# Patient Record
Sex: Female | Born: 1945
Health system: Southern US, Community
[De-identification: ages and names within clinical notes are randomized; demographics above are authoritative.]

## PROBLEM LIST (undated history)

## (undated) DIAGNOSIS — N943 Premenstrual tension syndrome: Secondary | ICD-10-CM

## (undated) DIAGNOSIS — M8580 Other specified disorders of bone density and structure, unspecified site: Secondary | ICD-10-CM

## (undated) DIAGNOSIS — M1990 Unspecified osteoarthritis, unspecified site: Secondary | ICD-10-CM

## (undated) DIAGNOSIS — K5790 Diverticulosis of intestine, part unspecified, without perforation or abscess without bleeding: Secondary | ICD-10-CM

## (undated) DIAGNOSIS — G43909 Migraine, unspecified, not intractable, without status migrainosus: Secondary | ICD-10-CM

## (undated) DIAGNOSIS — M4850XA Collapsed vertebra, not elsewhere classified, site unspecified, initial encounter for fracture: Secondary | ICD-10-CM

## (undated) DIAGNOSIS — H409 Unspecified glaucoma: Secondary | ICD-10-CM

## (undated) HISTORY — DX: Unspecified osteoarthritis, unspecified site: M19.90

## (undated) HISTORY — DX: Unspecified glaucoma: H40.9

## (undated) HISTORY — DX: Collapsed vertebra, not elsewhere classified, site unspecified, initial encounter for fracture: M48.50XA

## (undated) HISTORY — DX: Premenstrual tension syndrome: N94.3

## (undated) HISTORY — DX: Diverticulosis of intestine, part unspecified, without perforation or abscess without bleeding: K57.90

## (undated) HISTORY — DX: Other specified disorders of bone density and structure, unspecified site: M85.80

## (undated) HISTORY — DX: Migraine, unspecified, not intractable, without status migrainosus: G43.909

---

## 1998-07-23 ENCOUNTER — Other Ambulatory Visit: Admission: RE | Admit: 1998-07-23 | Discharge: 1998-07-23 | Payer: Self-pay | Admitting: Family Medicine

## 1999-02-24 ENCOUNTER — Encounter: Payer: Self-pay | Admitting: Family Medicine

## 1999-02-24 ENCOUNTER — Ambulatory Visit (HOSPITAL_COMMUNITY): Admission: RE | Admit: 1999-02-24 | Discharge: 1999-02-24 | Payer: Self-pay | Admitting: Family Medicine

## 1999-10-24 ENCOUNTER — Other Ambulatory Visit: Admission: RE | Admit: 1999-10-24 | Discharge: 1999-10-24 | Payer: Self-pay | Admitting: Family Medicine

## 2000-10-27 ENCOUNTER — Other Ambulatory Visit: Admission: RE | Admit: 2000-10-27 | Discharge: 2000-10-27 | Payer: Self-pay | Admitting: Family Medicine

## 2001-02-17 HISTORY — PX: LUMBAR DISC SURGERY: SHX700

## 2001-12-13 ENCOUNTER — Encounter: Payer: Self-pay | Admitting: Family Medicine

## 2001-12-13 ENCOUNTER — Encounter: Admission: RE | Admit: 2001-12-13 | Discharge: 2001-12-13 | Payer: Self-pay | Admitting: Family Medicine

## 2001-12-21 ENCOUNTER — Encounter: Admission: RE | Admit: 2001-12-21 | Discharge: 2002-01-19 | Payer: Self-pay | Admitting: Family Medicine

## 2001-12-30 ENCOUNTER — Other Ambulatory Visit: Admission: RE | Admit: 2001-12-30 | Discharge: 2001-12-30 | Payer: Self-pay | Admitting: Family Medicine

## 2002-01-05 ENCOUNTER — Encounter: Admission: RE | Admit: 2002-01-05 | Discharge: 2002-01-05 | Payer: Self-pay | Admitting: Family Medicine

## 2002-01-05 ENCOUNTER — Encounter: Payer: Self-pay | Admitting: Family Medicine

## 2002-01-07 ENCOUNTER — Encounter: Admission: RE | Admit: 2002-01-07 | Discharge: 2002-01-07 | Payer: Self-pay | Admitting: Family Medicine

## 2002-01-07 ENCOUNTER — Encounter: Payer: Self-pay | Admitting: Family Medicine

## 2002-01-20 ENCOUNTER — Encounter: Payer: Self-pay | Admitting: Neurosurgery

## 2002-01-20 ENCOUNTER — Ambulatory Visit (HOSPITAL_COMMUNITY): Admission: RE | Admit: 2002-01-20 | Discharge: 2002-01-20 | Payer: Self-pay | Admitting: Neurosurgery

## 2002-01-25 ENCOUNTER — Encounter: Payer: Self-pay | Admitting: Neurosurgery

## 2002-01-25 ENCOUNTER — Ambulatory Visit (HOSPITAL_COMMUNITY): Admission: RE | Admit: 2002-01-25 | Discharge: 2002-01-26 | Payer: Self-pay | Admitting: Neurosurgery

## 2003-01-02 ENCOUNTER — Other Ambulatory Visit: Admission: RE | Admit: 2003-01-02 | Discharge: 2003-01-02 | Payer: Self-pay | Admitting: Family Medicine

## 2003-12-26 ENCOUNTER — Ambulatory Visit: Payer: Self-pay | Admitting: Family Medicine

## 2004-01-02 ENCOUNTER — Other Ambulatory Visit: Admission: RE | Admit: 2004-01-02 | Discharge: 2004-01-02 | Payer: Self-pay | Admitting: Family Medicine

## 2004-01-02 ENCOUNTER — Ambulatory Visit: Payer: Self-pay | Admitting: Family Medicine

## 2004-02-06 ENCOUNTER — Ambulatory Visit: Payer: Self-pay | Admitting: Family Medicine

## 2004-02-07 ENCOUNTER — Ambulatory Visit: Payer: Self-pay | Admitting: Family Medicine

## 2004-02-18 HISTORY — PX: COLONOSCOPY: SHX174

## 2004-03-13 ENCOUNTER — Ambulatory Visit: Payer: Self-pay | Admitting: Internal Medicine

## 2004-03-29 ENCOUNTER — Ambulatory Visit: Payer: Self-pay | Admitting: Internal Medicine

## 2004-06-11 ENCOUNTER — Ambulatory Visit: Payer: Self-pay | Admitting: Family Medicine

## 2004-07-03 ENCOUNTER — Ambulatory Visit: Payer: Self-pay | Admitting: Family Medicine

## 2005-01-06 ENCOUNTER — Ambulatory Visit: Payer: Self-pay | Admitting: Family Medicine

## 2005-05-27 ENCOUNTER — Ambulatory Visit: Payer: Self-pay | Admitting: Family Medicine

## 2005-06-17 ENCOUNTER — Other Ambulatory Visit: Admission: RE | Admit: 2005-06-17 | Discharge: 2005-06-17 | Payer: Self-pay | Admitting: Family Medicine

## 2005-06-17 ENCOUNTER — Encounter: Payer: Self-pay | Admitting: Family Medicine

## 2005-06-17 ENCOUNTER — Ambulatory Visit: Payer: Self-pay | Admitting: Family Medicine

## 2005-07-08 DIAGNOSIS — C4491 Basal cell carcinoma of skin, unspecified: Secondary | ICD-10-CM

## 2005-07-08 HISTORY — DX: Basal cell carcinoma of skin, unspecified: C44.91

## 2005-08-25 ENCOUNTER — Ambulatory Visit: Payer: Self-pay | Admitting: Family Medicine

## 2005-10-27 ENCOUNTER — Ambulatory Visit: Payer: Self-pay | Admitting: *Deleted

## 2005-10-27 ENCOUNTER — Ambulatory Visit: Payer: Self-pay | Admitting: Family Medicine

## 2006-05-05 ENCOUNTER — Ambulatory Visit: Payer: Self-pay | Admitting: Family Medicine

## 2006-11-09 ENCOUNTER — Encounter: Payer: Self-pay | Admitting: Family Medicine

## 2007-01-28 ENCOUNTER — Encounter: Payer: Self-pay | Admitting: Family Medicine

## 2007-03-03 ENCOUNTER — Ambulatory Visit: Payer: Self-pay | Admitting: Family Medicine

## 2007-03-03 LAB — CONVERTED CEMR LAB
ALT: 16 units/L (ref 0–35)
AST: 23 units/L (ref 0–37)
Albumin: 4.3 g/dL (ref 3.5–5.2)
Alkaline Phosphatase: 43 units/L (ref 39–117)
BUN: 15 mg/dL (ref 6–23)
Basophils Absolute: 0 10*3/uL (ref 0.0–0.1)
Basophils Relative: 0.1 % (ref 0.0–1.0)
Bilirubin, Direct: 0.1 mg/dL (ref 0.0–0.3)
Calcium: 10 mg/dL (ref 8.4–10.5)
Chloride: 106 meq/L (ref 96–112)
Cholesterol: 252 mg/dL (ref 0–200)
Eosinophils Absolute: 0.1 10*3/uL (ref 0.0–0.6)
Eosinophils Relative: 1.5 % (ref 0.0–5.0)
GFR calc Af Amer: 73 mL/min
HCT: 40.7 % (ref 36.0–46.0)
HDL: 77.8 mg/dL (ref >39.0)
MCV: 96.8 fL (ref 78.0–100.0)
Monocytes Relative: 8.5 % (ref 3.0–11.0)
Potassium: 5 meq/L (ref 3.5–5.1)
RBC: 4.21 M/uL (ref 3.87–5.11)
Sodium: 147 meq/L — ABNORMAL HIGH (ref 135–145)
TSH: 1.11 microintl units/mL (ref 0.35–5.50)
Total Protein: 7.2 g/dL (ref 6.0–8.3)
Triglycerides: 58 mg/dL (ref 0–149)
WBC Urine, dipstick: NEGATIVE
WBC: 5.6 10*3/uL (ref 4.5–10.5)

## 2007-03-15 ENCOUNTER — Other Ambulatory Visit: Admission: RE | Admit: 2007-03-15 | Discharge: 2007-03-15 | Payer: Self-pay | Admitting: Family Medicine

## 2007-03-15 ENCOUNTER — Ambulatory Visit: Payer: Self-pay | Admitting: Family Medicine

## 2007-03-15 ENCOUNTER — Encounter: Payer: Self-pay | Admitting: Family Medicine

## 2007-03-15 DIAGNOSIS — G43909 Migraine, unspecified, not intractable, without status migrainosus: Secondary | ICD-10-CM

## 2007-03-15 DIAGNOSIS — M899 Disorder of bone, unspecified: Secondary | ICD-10-CM | POA: Insufficient documentation

## 2007-03-15 DIAGNOSIS — M949 Disorder of cartilage, unspecified: Secondary | ICD-10-CM

## 2007-03-15 DIAGNOSIS — N959 Unspecified menopausal and perimenopausal disorder: Secondary | ICD-10-CM | POA: Insufficient documentation

## 2007-07-05 DIAGNOSIS — L255 Unspecified contact dermatitis due to plants, except food: Secondary | ICD-10-CM

## 2007-07-13 ENCOUNTER — Ambulatory Visit: Payer: Self-pay | Admitting: Family Medicine

## 2007-10-13 DIAGNOSIS — C4491 Basal cell carcinoma of skin, unspecified: Secondary | ICD-10-CM

## 2007-10-13 HISTORY — DX: Basal cell carcinoma of skin, unspecified: C44.91

## 2007-11-12 DIAGNOSIS — M549 Dorsalgia, unspecified: Secondary | ICD-10-CM

## 2007-11-12 DIAGNOSIS — M545 Low back pain, unspecified: Secondary | ICD-10-CM | POA: Insufficient documentation

## 2007-11-16 ENCOUNTER — Ambulatory Visit: Payer: Self-pay | Admitting: Family Medicine

## 2008-02-01 ENCOUNTER — Encounter: Payer: Self-pay | Admitting: Family Medicine

## 2008-03-22 ENCOUNTER — Ambulatory Visit: Payer: Self-pay | Admitting: Family Medicine

## 2008-03-22 LAB — CONVERTED CEMR LAB
ALT: 12 units/L (ref 0–35)
AST: 24 units/L (ref 0–37)
Albumin: 3.8 g/dL (ref 3.5–5.2)
BUN: 16 mg/dL (ref 6–23)
CO2: 32 meq/L (ref 19–32)
Creatinine, Ser: 0.9 mg/dL (ref 0.4–1.2)
Direct LDL: 143.7 mg/dL
Eosinophils Absolute: 0.1 10*3/uL (ref 0.0–0.7)
Eosinophils Relative: 2.6 % (ref 0.0–5.0)
GFR calc non Af Amer: 68 mL/min
HCT: 38.7 % (ref 36.0–46.0)
Lymphocytes Relative: 28.9 % (ref 12.0–46.0)
MCV: 97 fL (ref 78.0–100.0)
Monocytes Absolute: 0.5 10*3/uL (ref 0.1–1.0)
Neutrophils Relative %: 57.6 % (ref 43.0–77.0)
Platelets: 160 10*3/uL (ref 150–400)
Potassium: 4.5 meq/L (ref 3.5–5.1)
RDW: 12.9 % (ref 11.5–14.6)
Specific Gravity, Urine: 1.02
TSH: 2.15 microintl units/mL (ref 0.35–5.50)
Total Bilirubin: 0.6 mg/dL (ref 0.3–1.2)
Total CHOL/HDL Ratio: 3.2
VLDL: 11 mg/dL (ref 0–40)
pH: 7

## 2008-03-30 ENCOUNTER — Other Ambulatory Visit: Admission: RE | Admit: 2008-03-30 | Discharge: 2008-03-30 | Payer: Self-pay | Admitting: Family Medicine

## 2008-03-30 ENCOUNTER — Ambulatory Visit: Payer: Self-pay | Admitting: Family Medicine

## 2008-03-30 ENCOUNTER — Encounter: Payer: Self-pay | Admitting: Family Medicine

## 2008-03-30 DIAGNOSIS — R141 Gas pain: Secondary | ICD-10-CM

## 2008-03-30 DIAGNOSIS — N63 Unspecified lump in unspecified breast: Secondary | ICD-10-CM | POA: Insufficient documentation

## 2008-03-30 DIAGNOSIS — R143 Flatulence: Secondary | ICD-10-CM

## 2008-03-30 DIAGNOSIS — R142 Eructation: Secondary | ICD-10-CM

## 2008-04-04 ENCOUNTER — Encounter: Payer: Self-pay | Admitting: Family Medicine

## 2008-04-05 ENCOUNTER — Ambulatory Visit: Payer: Self-pay | Admitting: Family Medicine

## 2008-07-20 ENCOUNTER — Telehealth: Payer: Self-pay | Admitting: *Deleted

## 2008-07-24 ENCOUNTER — Encounter: Payer: Self-pay | Admitting: *Deleted

## 2008-07-25 ENCOUNTER — Telehealth: Payer: Self-pay | Admitting: *Deleted

## 2008-07-26 ENCOUNTER — Telehealth: Payer: Self-pay | Admitting: *Deleted

## 2009-02-13 ENCOUNTER — Encounter: Payer: Self-pay | Admitting: *Deleted

## 2009-07-26 ENCOUNTER — Encounter: Admission: RE | Admit: 2009-07-26 | Discharge: 2009-07-26 | Payer: Self-pay | Admitting: Neurosurgery

## 2009-08-03 ENCOUNTER — Ambulatory Visit: Payer: Self-pay | Admitting: Family Medicine

## 2009-08-03 DIAGNOSIS — S22009A Unspecified fracture of unspecified thoracic vertebra, initial encounter for closed fracture: Secondary | ICD-10-CM | POA: Insufficient documentation

## 2009-08-08 ENCOUNTER — Ambulatory Visit: Payer: Self-pay | Admitting: Internal Medicine

## 2009-08-08 ENCOUNTER — Encounter: Payer: Self-pay | Admitting: Family Medicine

## 2010-01-29 ENCOUNTER — Ambulatory Visit: Payer: Self-pay | Admitting: Family Medicine

## 2010-01-29 LAB — CONVERTED CEMR LAB
ALT: 17 units/L (ref 0–35)
Albumin: 4 g/dL (ref 3.5–5.2)
Basophils Absolute: 0 10*3/uL (ref 0.0–0.1)
Basophils Relative: 0.9 % (ref 0.0–3.0)
Bilirubin Urine: NEGATIVE
Bilirubin, Direct: 0.1 mg/dL (ref 0.0–0.3)
Blood in Urine, dipstick: NEGATIVE
Calcium: 9.2 mg/dL (ref 8.4–10.5)
Cholesterol: 261 mg/dL — ABNORMAL HIGH (ref 0–200)
Creatinine, Ser: 0.9 mg/dL (ref 0.4–1.2)
Direct LDL: 166 mg/dL
Eosinophils Relative: 2.2 % (ref 0.0–5.0)
HDL: 68.2 mg/dL (ref >39.00)
Hemoglobin: 13.4 g/dL (ref 12.0–15.0)
MCV: 96.1 fL (ref 78.0–100.0)
Monocytes Relative: 11.1 % (ref 3.0–12.0)
Neutro Abs: 2.4 10*3/uL (ref 1.4–7.7)
Neutrophils Relative %: 58.6 % (ref 43.0–77.0)
Nitrite: NEGATIVE
Platelets: 209 10*3/uL (ref 150.0–400.0)
Potassium: 5.2 meq/L — ABNORMAL HIGH (ref 3.5–5.1)
Protein, U semiquant: NEGATIVE
RBC: 4.06 M/uL (ref 3.87–5.11)
RDW: 13.8 % (ref 11.5–14.6)
Total Protein: 6.7 g/dL (ref 6.0–8.3)

## 2010-02-05 ENCOUNTER — Encounter: Payer: Self-pay | Admitting: Family Medicine

## 2010-02-05 ENCOUNTER — Ambulatory Visit: Payer: Self-pay | Admitting: Family Medicine

## 2010-02-14 ENCOUNTER — Telehealth: Payer: Self-pay | Admitting: Family Medicine

## 2010-03-10 ENCOUNTER — Encounter: Payer: Self-pay | Admitting: Neurosurgery

## 2010-03-21 NOTE — Miscellaneous (Signed)
Summary: BONE DENSITY  Clinical Lists Changes  Orders: Added new Test order of T-Lumbar Vertebral Assessment (77082) - Signed 

## 2010-03-21 NOTE — Progress Notes (Signed)
Summary: rx request  Phone Note Call from Patient   Summary of Call: rx request  Initial call taken by: Kern Reap CMA Duncan Dull),  February 14, 2010 9:32 AM    New/Updated Medications: IBUPROFEN 600 MG TABS (IBUPROFEN) take one tab once daily as needed for pain ONE-A-DAY WOMENS 50 PLUS  TABS (MULTIPLE VITAMINS-MINERALS) take one tab once daily FISH OIL  OIL (FISH OIL) take one tab once daily PHILLIPS COLON HEALTH  CAPS (PROBIOTIC PRODUCT) take one tab once daily FIBER SELECT GUMMIES  CHEW (FIBER) take one tab once daily Prescriptions: PHILLIPS COLON HEALTH  CAPS (PROBIOTIC PRODUCT) take one tab once daily  #100 x 4   Entered by:   Kern Reap CMA (AAMA)   Authorized by:   Roderick Pee MD   Signed by:   Kern Reap CMA (AAMA) on 02/14/2010   Method used:   Print then Give to Patient   RxID:   [id number] FISH OIL  OIL (FISH OIL) take one tab once daily  #100 x 4   Entered by:   Kern Reap CMA (AAMA)   Authorized by:   Roderick Pee MD   Signed by:   Kern Reap CMA (AAMA) on 02/14/2010   Method used:   Print then Give to Patient   RxID:   [id number] ONE-A-DAY WOMENS 50 PLUS  TABS (MULTIPLE VITAMINS-MINERALS) take one tab once daily  #100 x 4   Entered by:   Kern Reap CMA (AAMA)   Authorized by:   Roderick Pee MD   Signed by:   Kern Reap CMA (AAMA) on 02/14/2010   Method used:   Print then Give to Patient   RxID:   [id number] IBUPROFEN 600 MG TABS (IBUPROFEN) take one tab once daily as needed for pain  #100 x 4   Entered by:   Kern Reap CMA (AAMA)   Authorized by:   Roderick Pee MD   Signed by:   Kern Reap CMA (AAMA) on 02/14/2010   Method used:   Print then Give to Patient   RxID:   [id number]

## 2010-03-21 NOTE — Assessment & Plan Note (Signed)
Summary: CPX  - OK PER RACHEL, CMA // RS   Vital Signs:  Patient profile:   65 year old female Height:      65.75 inches Weight:      147 pounds BMI:     23.99 Temp:     98.0 degrees F oral BP sitting:   120 / 78  (left arm) Cuff size:   regular CC: cpx-- annual physical   Is Patient Diabetic? No   CC:  cpx-- annual physical  .  History of Present Illness: Rebecca Cameron is a delightful, 65 year old, single female, nonsmoker former Event organiser, who comes in today for annual physical examination  She uses hormonal cream once or twice weekly. For vaginal dryness.  Also, Imitrex nasal spray p.r.n. for migraine headaches.  In the last 12 months.  Her migraines have decreased.  She only had 3.  This year.  She also takes calcium, vitamin D, and nabumetone 500 mg b.i.d., p.r.n. for joint pain.  She has a lumbar disk problem, which flares up, occasionally she'll take her Flexeril and Vicodin at bedtime p.r.n.  She gets routine eye care........Marland Kitchen pressures now are borderline at 18, followed by ophthalmologist...Marland KitchenMarland KitchenMarland Kitchen dental care, BSE monthly, annual mammography, colonoscopy, normal, tetanus, 2006, seasonal flu 2011, shingles 2010.  Allergies: No Known Drug Allergies  Past History:  Past medical, surgical, family and social histories (including risk factors) reviewed, and no changes noted (except as noted below).  Past Medical History: Reviewed history from 03/15/2007 and no changes required. PMS Migraine Headaches Osteopenia lumbar disk surgery 2003  Past Surgical History: Reviewed history from 09/18/2006 and no changes required. Lumbar Disc-2003 Colonoscopy-03/29/2006  Family History: Reviewed history from 03/15/2007 and no changes required. father died at age 25, fractured hip.  Underlying alcoholism mother died Alzheimer's disease  3 brothers, one died of alcoholism one sister, severe glaucoma  Social History: Reviewed history from 03/15/2007 and no changes  required. 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 No apparent alopecia or balding. Eyes:  No corneal or conjunctival inflammation noted. EOMI. Perrla. Funduscopic exam benign, without hemorrhages, exudates or papilledema. Vision grossly normal. Ears:  External ear exam shows no significant lesions or deformities.  Otoscopic examination reveals clear canals, tympanic membranes are intact bilaterally without bulging, retraction, inflammation or discharge. Hearing is grossly normal bilaterally. Nose:  External nasal examination shows no deformity or inflammation. Nasal mucosa are pink and moist without lesions or exudates. Mouth:  Oral mucosa and oropharynx without lesions or exudates.  Teeth in good repair. Neck:  No deformities, masses, or tenderness noted. Chest Wall:  No deformities, masses, or tenderness noted. Breasts:  No mass, nodules, thickening, tenderness, bulging, retraction, inflamation, nipple discharge or skin changes noted.   Lungs:  Normal respiratory effort, chest expands symmetrically. Lungs are clear to auscultation, no crackles or wheezes. Heart:  Normal rate and regular rhythm. S1 and S2 normal without gallop, murmur, click, rub or other extra sounds. Abdomen:  Bowel sounds positive,abdomen soft and non-tender without masses, organomegaly or hernias noted. Rectal:  No external abnormalities noted. Normal sphincter tone. No rectal masses or tenderness. Genitalia:  Pelvic Exam:        External: normal female genitalia without lesions or masses        Vagina:  normal without lesions or masses        Cervix: normal without lesions or masses        Adnexa: normal bimanual  exam without masses or fullness        Uterus: normal by palpation        Pap smear: not performed Msk:  No deformity or scoliosis noted of thoracic or lumbar spine.   Pulses:  R and L carotid,radial,femoral,dorsalis pedis and posterior tibial pulses are full and equal bilaterally Extremities:  No clubbing, cyanosis, edema, or deformity noted with normal full range of motion of all joints.   Neurologic:  No cranial nerve deficits noted. Station and gait are normal. Plantar reflexes are down-going bilaterally. DTRs are symmetrical throughout. Sensory, motor and coordinative functions appear intact. Skin:  Intact without suspicious lesions or rashes Cervical Nodes:  No lymphadenopathy noted Axillary Nodes:  No palpable lymphadenopathy Inguinal Nodes:  No significant adenopathy Psych:  Cognition and judgment appear intact. Alert and cooperative with normal attention span and concentration. No apparent delusions, illusions, hallucinations   Impression & Recommendations:  Problem # 1:  RHUS DERMATITIS (ICD-692.6) Assessment Improved  Problem # 2:  MIGRAINE HEADACHE (ICD-346.90) Assessment: Improved  Her updated medication list for this problem includes:    Imitrex 20 Mg/act Soln (Sumatriptan) ..... Uad    Nabumetone 500 Mg Tabs (Nabumetone) .Marland Kitchen... Take one tab two times a day    Vicodin Es 7.5-750 Mg Tabs (Hydrocodone-acetaminophen) .Marland Kitchen... 1 tab @ bedtime  Orders: Prescription Created Electronically [phone])  Problem # 3:  HEALTH SCREENING (ICD-V70.0) Assessment: Unchanged  Orders: Prescription Created Electronically [phone])  Problem # 4:  COMPRESSION FRACTURE, THORACIC VERTEBRA (ICD-805.2) Assessment: Improved  Complete Medication List: 1)  Flexeril 10 Mg Tabs (Cyclobenzaprine hcl) .... Prn 2)  Calcitrate/vitamin D 315-200 Mg-unit Tabs (Calcium citrate-vitamin d) .... Take 1 tablet by mouth once a day 3)  Imitrex 20 Mg/act Soln (Sumatriptan) .... Uad 4)  Estrace 0.1 Mg/gm Crea  (Estradiol) .... Insert 4 gm,  2 times a week 5)  Nabumetone 500 Mg Tabs (Nabumetone) .... Take one tab two times a day 6)  Vicodin Es 7.5-750 Mg Tabs (Hydrocodone-acetaminophen) .Marland Kitchen.. 1 tab @ bedtime  Other Orders: EKG w/ Interpretation (93000)  Patient Instructions: 1)  Please schedule a follow-up appointment in 1 year. Prescriptions: CALCITRATE/VITAMIN D 315-200 MG-UNIT  TABS (CALCIUM CITRATE-VITAMIN D) Take 1 tablet by mouth once a day  #100 x 3   Entered and Authorized by:   Roderick Pee MD   Signed by:   Roderick Pee MD on 02/05/2010   Method used:   Print then Give to Patient   RxID:   [id number] NABUMETONE 500 MG TABS (NABUMETONE) take one tab two times a day  #100 x 6   Entered and Authorized by:   Roderick Pee MD   Signed by:   Roderick Pee MD on 02/05/2010   Method used:   Electronically to        [facility] (retail)       [address] [place], [place]  [address]       Ph: [phone]       Fax: [phone]   RxID:   [id number] ESTRACE 0.1 MG/GM  CREA (ESTRADIOL) insert 4 gm,  2 times a week  #127.5 Gram x 6   Entered and Authorized by:   Roderick Pee  MD   Signed by:   Roderick Pee MD on 02/05/2010   Method used:   Electronically to        [facility] (retail)       [address] [place], [place]  [address]       Ph: [phone]       Fax: [phone]   RxID:   [id number] Willette Brace 20 MG/ACT  SOLN (SUMATRIPTAN) UAD  #6 Each x 6   Entered and Authorized by:   Roderick Pee MD   Signed by:   Roderick Pee MD on 02/05/2010   Method used:   Electronically to        [facility] [phone]* (retail)       [address] [place], [place]  [address]       Ph: [phone]       Fax: [phone]   RxID:   [id number]    Orders Added: 1)  Prescription Created Electronically [phone] 2)  Est. Patient 40-64 years  [99396] 3)  EKG w/ Interpretation [93000]   Immunization History:  Influenza Immunization History:    Influenza:  historical (11/20/2009)   Immunization History:  Influenza Immunization History:    Influenza:  Historical (11/20/2009)

## 2010-03-21 NOTE — Assessment & Plan Note (Signed)
Summary: back pain/njr   Vital Signs:  Patient profile:   65 year old female Height:      65.75 inches Weight:      142 pounds BMI:     23.18 Temp:     98.2 degrees F oral BP sitting:   130 / 90  (left arm) Cuff size:   regular  Vitals Entered By: Kern Reap CMA Duncan Dull) (August 03, 2009 1:53 PM) CC: back pain, irritation on left ankle   CC:  back pain and irritation on left ankle.  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  She went to see Dr. Jeral Fruit.  Her neurosurgeon.  Plain x-rays appeared normal however, because of severe pain.  They did a MRI on June the ninth.  MRI showed a 40% loss of the T5 vertebrae.  Completely healed.  Dr. Jeral Fruit told her she had a  old  compression fracture, but nothing the neurosurgery Can do,    He referred her back to me for follow-up.  She's been on Fosamax in the past because of osteo- opinion.  We stopped the Fosamax two years ago because of the negative studies about Fosamax, and osteopenia.  She wonders if she should have another bone density.  She also is a red, irritated.  The lesion on her right lower ankle.  She noticed this on Tuesday.  She played golf on Saturday 3 days before.  She has a history of contact dermatitis in the past  her symptoms include back pain around the T5 area.  Sometimes is constant, sometimes will come and go.  It typically if she can get off her feet and takes something for pain.  The next day the soreness is gone.  Allergies: No Known Drug Allergies  Past History:  Past medical, surgical, family and social histories (including risk factors) reviewed for relevance to current acute and chronic problems.  Past Medical History: Reviewed history from 03/15/2007 and no changes required. PMS Migraine Headaches Osteopenia lumbar disk surgery 2003  Past Surgical History: Reviewed  history from 09/18/2006 and no changes required. Lumbar Disc-2003 Colonoscopy-03/29/2006  Family History: Reviewed history from 03/15/2007 and no changes required. father died at age 63, fractured hip.  Underlying alcoholism mother died Alzheimer's disease  3 brothers, one died of alcoholism one sister, severe glaucoma  Social History: Reviewed history from 03/15/2007 and no changes required. 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 Prn 2)  Calcitrate/vitamin D 315-200 Mg-unit Tabs (Calcium citrate-vitamin d) .... Take 1 tablet by mouth once a day 3)  Imitrex 20 Mg/act Soln (Sumatriptan) .... Uad 4)  Estrace 0.1 Mg/gm Crea (Estradiol) .... Insert 4 gm,  2 times a week 5)  Nabumetone 500 Mg Tabs (Nabumetone) .... Take one tab two times a day 6)  Vicodin Es 7.5-750 Mg Tabs (Hydrocodone-acetaminophen) .Marland Kitchen.. 1 tab @ bedtime  Patient Instructions: 1)  I will call you when I get  the report back from your bone density. 2)  When your back starts hurting, take a half of Flexeril and Vicodin at bedtime.  If that does not work and your having a lot of pain.  The next day, take a half of Flexeril and Vicodin 3 times a day, and stay at bed rest for a day.  Return p.r.n.Marland Kitchen  Remember to take calcium and vitamin D daily Prescriptions: FLEXERIL 10 MG  TABS (CYCLOBENZAPRINE HCL) prn  #50 x 2   Entered and Authorized by:   Roderick Pee MD   Signed by:   Roderick Pee  MD on 08/03/2009   Method used:   Print then Give to Patient   RxID:   [id number] VICODIN ES 7.5-750 MG TABS (HYDROCODONE-ACETAMINOPHEN) 1 tab @ bedtime  #50 x 2   Entered and Authorized by:   Roderick Pee MD   Signed by:   Roderick Pee MD on 08/03/2009   Method used:   Print then Give to Patient   RxID:   [id number]

## 2010-06-12 ENCOUNTER — Other Ambulatory Visit: Payer: Self-pay | Admitting: Neurosurgery

## 2010-06-12 DIAGNOSIS — M549 Dorsalgia, unspecified: Secondary | ICD-10-CM

## 2010-06-13 ENCOUNTER — Ambulatory Visit
Admission: RE | Admit: 2010-06-13 | Discharge: 2010-06-13 | Disposition: A | Discharge status: Home / Self Care | Payer: PRIVATE HEALTH INSURANCE | Source: Ambulatory Visit | Attending: Neurosurgery | Admitting: Neurosurgery

## 2010-06-13 DIAGNOSIS — M549 Dorsalgia, unspecified: Secondary | ICD-10-CM

## 2010-07-04 ENCOUNTER — Other Ambulatory Visit: Payer: Self-pay | Admitting: Neurosurgery

## 2010-07-04 DIAGNOSIS — M546 Pain in thoracic spine: Secondary | ICD-10-CM

## 2010-07-05 NOTE — H&P (Signed)
NAME:  Rebecca Cameron, Rebecca Cameron                        ACCOUNT NO.:  [id number]   MEDICAL RECORD NO.:  [id number]                   PATIENT TYPE:  OIB   LOCATION:  NA                                   FACILITY:  [facility]   PHYSICIAN:  Hilda Lias, M.D.                DATE OF BIRTH:  [birth year]   DATE OF ADMISSION:  01/25/2002  DATE OF DISCHARGE:                                HISTORY & PHYSICAL   HISTORY OF PRESENT ILLNESS:  This is a lady who was seen by me on December 3  because of back pain with radiation down to the hip all the way  posterolateral to the right foot associated with weakness.  The patient was  miserable and she came to my office crying.  The MRI was read as  unremarkable and because of that I decided to bring her the following day to  have a myelogram followed by a CT scan which showed that she has a foraminal  herniated disk.  Surgery was advised, but the patient decided not to have  surgery.  Over the weekend she called my office because she was getting  worse and she wanted to proceed with surgery.  She had told me that her  family did not want her to have surgery and we talked about epidural  injections, but then she declined and she wanted to go ahead with surgery as  soon as possible.  The patient denied any pain in the left leg.   PAST MEDICAL HISTORY:  Negative.   ALLERGIES:  She is not allergic to any medication.   SOCIAL HISTORY:  She drinks socially.   FAMILY HISTORY:  Negative.   REVIEW OF SYMPTOMS:  Only positive for back and right leg pain.   PHYSICAL EXAMINATION:  GENERAL: The patient came to my office as she had  weakness of the right leg.  HEENT:  Head, ears, nose and throat are normal.  Neck is normal.  LUNGS:  Clear.  HEART:  Heart sounds are normal.  There are no murmurs.  ABDOMEN:  Normal.  EXTREMITIES:  Normal.  NEUROLOGIC:  Mental status is normal.  Cranial nerves are normal.  Strength  is 5/5 except for weakness with flexion of the right  foot.  Reflexes are  present.  The right ankle jerk is present with _________ of the left leg.  Full vibration on the right side possibly at about 45 degrees.   LABORATORY DATA:  The myelogram showed that indeed she has a herniated disk  at L5-S1 with symptoms on the right side mostly in the foramen compromising  the S1 nerve root.   IMPRESSION:  L5-S1 herniated disk.    RECOMMENDATIONS:  The patient will be admitted for surgery.  The procedure  will be a right L5-S1 diskectomy using the Metrix system as well as x-ray.  The risks were explained to include infection, cerebrospinal fluid leak,  worsening of pain, paresthesias, no improvement whatsoever and need for  further surgery.                                               Hilda Lias, M.D.    EB/MEDQ  D:  01/25/2002  T:  01/25/2002  Job:  [id number]

## 2010-07-05 NOTE — Op Note (Signed)
NAME:  Rebecca Cameron, Rebecca Cameron                        ACCOUNT NO.:  [id number]   MEDICAL RECORD NO.:  [id number]                   PATIENT TYPE:  OIB   LOCATION:  3009                                 FACILITY:  [facility]   PHYSICIAN:  Hilda Lias, M.D.                DATE OF BIRTH:  [birth year]   DATE OF PROCEDURE:  01/25/2002  DATE OF DISCHARGE:                                 OPERATIVE REPORT   PREOPERATIVE DIAGNOSIS:  Right L5-S1 herniated disk with S1 radiculopathy.   POSTOPERATIVE DIAGNOSIS:  Right L5-S1 herniated disk with at least seven  fragments going into the body of L5.   PROCEDURE:  Right L5-S1 diskectomy using the Met-RX system as well as the  microscope.  C-arm.   SURGEON:  Hilda Lias, M.D.   ASSISTANT:  Stefani Dama, M.D.   CLINICAL HISTORY:  The patient is a 65 year old female who was seen by me in  my office because of back and right leg pain.  MRI was remarkable.  MRI  showed the possibility of herniated disk at the level of 5-1 in the foramen.  The patient was given the offer of conservative treatment, but she wanted to  proceed with surgery.  The surgery was fully explained to her.   DESCRIPTION OF PROCEDURE:  The patient was taken to the OR, and she was  positioned in a prone manner.  The back was prepped with Betadine.  With the  C-arm we were able to localize the area of the L5-S1 lamina as well as the  facet.  Incision about an inch away from the midline was made after  _______________.  The incision was carried down and with the dilator, we  were able to introduce a final 7 mm and inch dilator into the L5-S1.  We  brought the microscope into the area.  We drilled.  We drilled the lower  lamina of L5 and the upper part of S1 and one-third of the medial facet.  The yellow ligament was also excised.  Then retraction was done.  The S1  nerve root was found to be swollen.  Retraction was made, and there was a  bulging disk.  We tried to enter the disk space,  but it was quite narrow.  Then investigating going into the body of L5, we found at least eight  fragments of disk compromising the underside of the S1 as well as the L5.  After we did this, we did a foraminotomy carried over the S1 and L5 nerve  root.  Valsalva maneuver was negative.  Investigation showed no more  fragments.  From then on Depo-Medrol and fentanyl were left, and the wound  was closed with Vicryl and Steri-Strip.  Hilda Lias, M.D.    EB/MEDQ  D:  01/25/2002  T:  01/26/2002  Job:  [id number]

## 2010-07-08 ENCOUNTER — Ambulatory Visit
Admission: RE | Admit: 2010-07-08 | Discharge: 2010-07-08 | Disposition: A | Discharge status: Home / Self Care | Payer: PRIVATE HEALTH INSURANCE | Source: Ambulatory Visit | Attending: Neurosurgery | Admitting: Neurosurgery

## 2010-07-08 DIAGNOSIS — M546 Pain in thoracic spine: Secondary | ICD-10-CM

## 2010-11-06 ENCOUNTER — Other Ambulatory Visit: Payer: Self-pay | Admitting: *Deleted

## 2010-11-06 MED ORDER — HYDROCODONE-ACETAMINOPHEN 7.5-750 MG PO TABS: 1.0000 | ORAL_TABLET | Freq: Four times a day (QID) | ORAL | Status: DC | PRN

## 2010-12-10 ENCOUNTER — Ambulatory Visit (HOSPITAL_BASED_OUTPATIENT_CLINIC_OR_DEPARTMENT_OTHER)
Admission: RE | Admit: 2010-12-10 | Discharge: 2010-12-10 | Disposition: A | Discharge status: Home / Self Care | Payer: BC Managed Care – PPO | Source: Ambulatory Visit | Attending: Orthopedic Surgery | Admitting: Orthopedic Surgery

## 2010-12-10 ENCOUNTER — Other Ambulatory Visit: Payer: Self-pay | Admitting: Orthopedic Surgery

## 2010-12-10 DIAGNOSIS — M6740 Ganglion, unspecified site: Secondary | ICD-10-CM | POA: Insufficient documentation

## 2010-12-10 LAB — POCT HEMOGLOBIN-HEMACUE: Hemoglobin: 14.5 g/dL (ref 12.0–15.0)

## 2010-12-20 NOTE — Op Note (Signed)
  NAMELISSETT, FAVORITE              ACCOUNT NO.:  [id number]  MEDICAL RECORD NO.:  [id number]  LOCATION:                                 FACILITY:  PHYSICIAN:  Cindee Salt, M.D.            DATE OF BIRTH:  DATE OF PROCEDURE:  12/10/2010 DATE OF DISCHARGE:                              OPERATIVE REPORT   PREOPERATIVE DIAGNOSIS:  Volar radial wrist ganglion, left wrist.  POSTOPERATIVE DIAGNOSIS:  Volar radial wrist ganglion, left wrist.  OPERATION:  Excision of volar radial wrist ganglion, left wrist.  SURGEON:  Cindee Salt, MD  ASSISTANT:  Betha Loa, MD  ANESTHESIA:  General with local infiltration.  ANESTHESIOLOGIST:  Burna Forts, MD  HISTORY:  The patient is a 65 year old female with history of a mass on the volar radial aspect of her left wrist, this pulsatile in nature; however, is cystic on examination.  She is desirous to having it removed.  Pre, peri and postoperative course have been discussed along with risks and complications.  She is aware there is no guarantee with the surgery, possibility of infection, recurrence of injury to arteries, nerves, tendons, incomplete relief of symptoms and dystrophy.  In the preoperative area, the patient was seen, the extremity marked by both the patient and surgeon, and antibiotic given.  PROCEDURE:  The patient was brought to the operating room where a general anesthetic was carried out without difficulty.  She was prepped using ChloraPrep, supine position with the left arm free.  A 3-minute dry time was allowed, time-out taken, confirming the patient and procedure.  A longitudinal incision was made over the mass carried down through the subcutaneous tissue.  Radial artery was identified and found to be drape directly over the ulnar aspect of the cyst.  With blunt and sharp dissection, this was dissected free.  The cyst was identified, the stalk followed down to the radiocarpal joint.  This was opened.  The cyst excised and  sent to pathology.  The wound was irrigated.  The capsular opening through which the cyst emerged was then closed with interrupted 4-0 Vicryl repeat sutures, the subcutaneous tissue was closed with interrupted 4-0 Vicryl, and the skin with interrupted 4-0 Vicryl Rapide.  A local infiltration was then given with 0.25% Marcaine without epinephrine, approximately 5 mL was used.  A sterile compressive dressing, splint to the wrist with the fingers free was applied.  On deflation of the tourniquet, all fingers were immediately pinked.  She was taken to the recovery room for observation in satisfactory condition.  She will be discharged to home return to the [facility] in 1 week, on Vicodin.          ______________________________ Cindee Salt, M.D.     GK/MEDQ  D:  12/10/2010  T:  12/10/2010  Job:  [id number]  Electronically Signed by Cindee Salt M.D. on 12/20/2010 03:45:23 PM

## 2011-02-05 ENCOUNTER — Other Ambulatory Visit (INDEPENDENT_AMBULATORY_CARE_PROVIDER_SITE_OTHER): Payer: BC Managed Care – PPO

## 2011-02-05 DIAGNOSIS — Z0000 Encounter for general adult medical examination without abnormal findings: Secondary | ICD-10-CM

## 2011-02-05 LAB — POCT URINALYSIS DIPSTICK
Bilirubin, UA: NEGATIVE
Blood, UA: NEGATIVE
Ketones, UA: NEGATIVE
Nitrite, UA: POSITIVE
pH, UA: 8.5

## 2011-02-05 LAB — BASIC METABOLIC PANEL
BUN: 20 mg/dL (ref 6–23)
CO2: 29 mEq/L (ref 19–32)
Calcium: 9.5 mg/dL (ref 8.4–10.5)
GFR: 70.52 mL/min (ref >60.00)
Potassium: 4.5 mEq/L (ref 3.5–5.1)

## 2011-02-05 LAB — CBC WITH DIFFERENTIAL/PLATELET
Eosinophils Absolute: 0 10*3/uL (ref 0.0–0.7)
Eosinophils Relative: 0.9 % (ref 0.0–5.0)
HCT: 39 % (ref 36.0–46.0)
Lymphs Abs: 1.2 10*3/uL (ref 0.7–4.0)
Monocytes Relative: 11.7 % (ref 3.0–12.0)
Neutrophils Relative %: 62.6 % (ref 43.0–77.0)
Platelets: 216 10*3/uL (ref 150.0–400.0)

## 2011-02-05 LAB — LIPID PANEL
Cholesterol: 251 mg/dL — ABNORMAL HIGH (ref 0–200)
Total CHOL/HDL Ratio: 3
Triglycerides: 95 mg/dL (ref 0.0–149.0)
VLDL: 19 mg/dL (ref 0.0–40.0)

## 2011-02-05 LAB — HEPATIC FUNCTION PANEL
ALT: 18 U/L (ref 0–35)
Albumin: 4.2 g/dL (ref 3.5–5.2)
Alkaline Phosphatase: 63 U/L (ref 39–117)
Total Protein: 7.4 g/dL (ref 6.0–8.3)

## 2011-02-10 ENCOUNTER — Encounter: Payer: Self-pay | Admitting: Family Medicine

## 2011-02-12 ENCOUNTER — Ambulatory Visit (INDEPENDENT_AMBULATORY_CARE_PROVIDER_SITE_OTHER): Payer: BC Managed Care – PPO | Admitting: Family Medicine

## 2011-02-12 ENCOUNTER — Encounter: Payer: Self-pay | Admitting: Family Medicine

## 2011-02-12 DIAGNOSIS — S22009A Unspecified fracture of unspecified thoracic vertebra, initial encounter for closed fracture: Secondary | ICD-10-CM

## 2011-02-12 DIAGNOSIS — N959 Unspecified menopausal and perimenopausal disorder: Secondary | ICD-10-CM

## 2011-02-12 DIAGNOSIS — G43909 Migraine, unspecified, not intractable, without status migrainosus: Secondary | ICD-10-CM

## 2011-02-12 DIAGNOSIS — M899 Disorder of bone, unspecified: Secondary | ICD-10-CM

## 2011-02-12 DIAGNOSIS — M549 Dorsalgia, unspecified: Secondary | ICD-10-CM

## 2011-02-12 DIAGNOSIS — Z136 Encounter for screening for cardiovascular disorders: Secondary | ICD-10-CM

## 2011-02-12 DIAGNOSIS — Z0000 Encounter for general adult medical examination without abnormal findings: Secondary | ICD-10-CM

## 2011-02-12 MED ORDER — NABUMETONE 500 MG PO TABS: 500.0000 mg | ORAL_TABLET | Freq: Two times a day (BID) | ORAL | Status: DC

## 2011-02-12 MED ORDER — HYDROCODONE-ACETAMINOPHEN 7.5-750 MG PO TABS: 1.0000 | ORAL_TABLET | Freq: Four times a day (QID) | ORAL | Status: DC | PRN

## 2011-02-12 MED ORDER — CYCLOBENZAPRINE HCL 10 MG PO TABS: 10.0000 mg | ORAL_TABLET | Freq: Every day | ORAL | Status: DC | PRN

## 2011-02-12 MED ORDER — ESTRADIOL 0.1 MG/GM VA CREA: 2.0000 g | TOPICAL_CREAM | Freq: Every day | VAGINAL | Status: DC

## 2011-02-12 NOTE — Progress Notes (Signed)
  Subjective:    Patient ID: Rebecca Cameron, female    DOB: [birth year], 65 y.o.   MRN: [id number]  HPI Rebecca Cameron is a 65 year old single female, nonsmoker, who comes in today for general physical examination because of history of osteopenia.......... 2  non-traumatic Compression fractures in her thoracic spine, postmenopausal vaginal dryness, migraine headaches.  She has been seen at the pain clinic and he uses Lidoderm patches p.r.n.  Also, she is on injectable Forteo daily for two years because of the two spontaneous fractures.  Her bone density showed some osteopenia.  No osteoporosis and again the fractures were nontraumatic.  She gets routine eye care, hearing normal, greater dental care, BSE monthly, and a mammography, colonoscopy, normal, tetanus, 2006, seasonal flu shot 2012, shingles 2010.  She uses the Premarin vaginal cream twice weekly for vaginal dryness.  She's never had any GYN problems.  Therefore, recommend Pap every 3 years   Review of Systems  Constitutional: Negative.   HENT: Negative.   Eyes: Negative.   Respiratory: Negative.   Cardiovascular: Negative.   Gastrointestinal: Negative.   Genitourinary: Negative.   Musculoskeletal: Negative.   Neurological: Negative.   Hematological: Negative.   Psychiatric/Behavioral: Negative.        Objective:   Physical Exam  Constitutional: She appears well-developed and well-nourished.  HENT:  Head: Normocephalic and atraumatic.  Right Ear: External ear normal.  Left Ear: External ear normal.  Nose: Nose normal.  Mouth/Throat: Oropharynx is clear and moist.  Eyes: EOM are normal. Pupils are equal, round, and reactive to light.  Neck: Normal range of motion. Neck supple. No thyromegaly present.  Cardiovascular: Normal rate, regular rhythm, normal heart sounds and intact distal pulses.  Exam reveals no gallop and no friction rub.   No murmur heard. Pulmonary/Chest: Effort normal and breath sounds normal.  Abdominal: Soft.  Bowel sounds are normal. She exhibits no distension and no mass. There is no tenderness. There is no rebound.  Genitourinary:       Bilateral breast exam normal  Musculoskeletal: Normal range of motion.  Lymphadenopathy:    She has no cervical adenopathy.  Neurological: She is alert. She has normal reflexes. No cranial nerve deficit. She exhibits normal muscle tone. Coordination normal.  Skin: Skin is warm and dry.  Psychiatric: She has a normal mood and affect. Her behavior is normal. Judgment and thought content normal.          Assessment & Plan:  Healthy female.  Non-traumatic compression fractures x 2.  Plan continue calcium vitamin D, exercise, and Forteo daily, x 2 years.  Postmenopausal vaginal dryness, use the Premarin vaginal cream twice weekly.  Back pain, recurrent, Flexeril, Vicodin p.r.n.  Migraine headaches, decreasing in frequency and severity, Imitrex p.r.n.  Return one year or sooner if any problem

## 2011-04-14 ENCOUNTER — Other Ambulatory Visit: Payer: Self-pay | Admitting: Family Medicine

## 2011-07-07 ENCOUNTER — Encounter: Payer: Self-pay | Admitting: Family Medicine

## 2011-07-07 ENCOUNTER — Ambulatory Visit (INDEPENDENT_AMBULATORY_CARE_PROVIDER_SITE_OTHER): Payer: BC Managed Care – PPO | Admitting: Family Medicine

## 2011-07-07 VITALS — BP 110/70 | Temp 97.8°F | Wt 144.0 lb

## 2011-07-07 DIAGNOSIS — M549 Dorsalgia, unspecified: Secondary | ICD-10-CM

## 2011-07-07 DIAGNOSIS — M949 Disorder of cartilage, unspecified: Secondary | ICD-10-CM

## 2011-07-07 DIAGNOSIS — M899 Disorder of bone, unspecified: Secondary | ICD-10-CM

## 2011-07-07 DIAGNOSIS — J45909 Unspecified asthma, uncomplicated: Secondary | ICD-10-CM

## 2011-07-07 MED ORDER — TRAMADOL HCL 50 MG PO TABS: ORAL_TABLET | ORAL | Status: DC

## 2011-07-07 MED ORDER — PREDNISONE 20 MG PO TABS: ORAL_TABLET | ORAL | Status: DC

## 2011-07-07 NOTE — Patient Instructions (Signed)
Take the prednisone as directed  Tramadol one half to one tablet twice daily as needed for back pain  Call u  dermatologist and insist on an appointment sometime in the next 2 weeks  We will get you set up a consult with Dr. Thyra Breed for a second opinion regarding the back pain

## 2011-07-07 NOTE — Progress Notes (Signed)
  Subjective:    Patient ID: Rebecca Cameron, female    DOB: 01-09-1946, 66 y.o.   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 He sent her to their pain specialist Dr. Ollen Bowl. He prescribed a Lidoderm patch and Vicodin one half tab twice a day when necessary she also went to see Dr. Cleon Gustin who put her on Forteo one shot daily in the morning. She doesn't feel like this combination has helped and would like a second opinion.  A month ago she developed head congestion postnasal drip and cough is persisted. There is fever minimal sputum production  She also has a spot on the left side of her face. She has an appointment to see her dermatologist because she's had a previous history of skin cancer    Review of Systems    general pulmonary neuromuscular review of systems otherwise negative Objective:   Physical Exam Well-developed and nourished female in no acute distress HEENT negative neck was supple no adenopathy lungs shows symmetrical breath sounds late mild expiratory symmetrical wheezing  Skin exam shows a lesion left side of her face appears to be a superficial basal cell       Assessment & Plan:  Reactive airway disease plan prednisone burst and taper  History of osteopenia with compression fractures x2 consult with Thyra Breed regarding ongoing pain  Abnormal lesion left face refer to dermatology

## 2011-08-28 ENCOUNTER — Other Ambulatory Visit: Payer: Self-pay | Admitting: Family Medicine

## 2011-09-16 ENCOUNTER — Other Ambulatory Visit: Payer: Self-pay | Admitting: Family Medicine

## 2011-11-12 ENCOUNTER — Ambulatory Visit (INDEPENDENT_AMBULATORY_CARE_PROVIDER_SITE_OTHER): Payer: BC Managed Care – PPO | Admitting: Family Medicine

## 2011-11-12 ENCOUNTER — Ambulatory Visit (INDEPENDENT_AMBULATORY_CARE_PROVIDER_SITE_OTHER)
Admission: RE | Admit: 2011-11-12 | Discharge: 2011-11-12 | Disposition: A | Discharge status: Home / Self Care | Payer: BC Managed Care – PPO | Source: Ambulatory Visit | Attending: Family Medicine | Admitting: Family Medicine

## 2011-11-12 ENCOUNTER — Encounter: Payer: Self-pay | Admitting: Family Medicine

## 2011-11-12 VITALS — BP 120/88 | Temp 97.6°F | Wt 140.0 lb

## 2011-11-12 DIAGNOSIS — M25552 Pain in left hip: Secondary | ICD-10-CM

## 2011-11-12 DIAGNOSIS — M25559 Pain in unspecified hip: Secondary | ICD-10-CM

## 2011-11-12 DIAGNOSIS — M549 Dorsalgia, unspecified: Secondary | ICD-10-CM

## 2011-11-12 MED ORDER — HYDROCODONE-ACETAMINOPHEN 7.5-750 MG PO TABS: 1.0000 | ORAL_TABLET | Freq: Four times a day (QID) | ORAL | Status: DC | PRN

## 2011-11-12 MED ORDER — LIDOCAINE 5 % EX PTCH: 1.0000 | MEDICATED_PATCH | CUTANEOUS | Status: DC

## 2011-11-12 NOTE — Progress Notes (Signed)
  Subjective:    Patient ID: Rebecca Cameron, female    DOB: January 18, 1946, 66 y.o.   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 She's noticed pain and she points to her left hip as a source of her discomfort for 2 months. No history of trauma. The pain has not gotten worse. It's a dull constant ache and she states as a 5 on a scale of 1-10. No fever chills vomiting diarrhea change in bowel habits etc.   Review of Systems    general and orthopedic review of systems otherwise negative Objective:   Physical Exam Well-developed and nourished female no acute distress examination the abdomen was normal she's tenderness over that left pelvic bone       Assessment & Plan:  Pain left pelvic bone unknown etiology x-ray to rule out underlying pathology

## 2011-11-12 NOTE — Patient Instructions (Signed)
Go to the main office now for the x-ray will call you a report

## 2012-03-22 ENCOUNTER — Other Ambulatory Visit (INDEPENDENT_AMBULATORY_CARE_PROVIDER_SITE_OTHER): Payer: BC Managed Care – PPO

## 2012-03-22 DIAGNOSIS — Z0000 Encounter for general adult medical examination without abnormal findings: Secondary | ICD-10-CM

## 2012-03-22 LAB — BASIC METABOLIC PANEL
CO2: 27 mEq/L (ref 19–32)
Calcium: 9.2 mg/dL (ref 8.4–10.5)
Chloride: 108 mEq/L (ref 96–112)
Creatinine, Ser: 0.9 mg/dL (ref 0.4–1.2)
Sodium: 142 mEq/L (ref 135–145)

## 2012-03-22 LAB — LIPID PANEL
Cholesterol: 240 mg/dL — ABNORMAL HIGH (ref 0–200)
Total CHOL/HDL Ratio: 3
VLDL: 11.6 mg/dL (ref 0.0–40.0)

## 2012-03-22 LAB — CBC WITH DIFFERENTIAL/PLATELET
Basophils Relative: 1.1 % (ref 0.0–3.0)
Eosinophils Relative: 3 % (ref 0.0–5.0)
Hemoglobin: 13.5 g/dL (ref 12.0–15.0)
Lymphocytes Relative: 25.1 % (ref 12.0–46.0)
Neutro Abs: 2.7 10*3/uL (ref 1.4–7.7)
Neutrophils Relative %: 61.1 % (ref 43.0–77.0)
RBC: 4.24 Mil/uL (ref 3.87–5.11)
WBC: 4.4 10*3/uL — ABNORMAL LOW (ref 4.5–10.5)

## 2012-03-22 LAB — HEPATIC FUNCTION PANEL
ALT: 14 U/L (ref 0–35)
AST: 18 U/L (ref 0–37)
Albumin: 4 g/dL (ref 3.5–5.2)
Alkaline Phosphatase: 54 U/L (ref 39–117)
Bilirubin, Direct: 0 mg/dL (ref 0.0–0.3)
Total Protein: 7 g/dL (ref 6.0–8.3)

## 2012-03-22 LAB — POCT URINALYSIS DIPSTICK
Blood, UA: NEGATIVE
Ketones, UA: NEGATIVE
Protein, UA: NEGATIVE
Spec Grav, UA: 1.015
Urobilinogen, UA: 0.2
pH, UA: 8

## 2012-03-22 LAB — LDL CHOLESTEROL, DIRECT: Direct LDL: 140.7 mg/dL

## 2012-03-29 ENCOUNTER — Other Ambulatory Visit (HOSPITAL_COMMUNITY)
Admission: RE | Admit: 2012-03-29 | Discharge: 2012-03-29 | Disposition: A | Discharge status: Home / Self Care | Payer: BC Managed Care – PPO | Source: Ambulatory Visit | Attending: Family Medicine | Admitting: Family Medicine

## 2012-03-29 ENCOUNTER — Other Ambulatory Visit: Payer: Self-pay | Admitting: *Deleted

## 2012-03-29 ENCOUNTER — Ambulatory Visit (INDEPENDENT_AMBULATORY_CARE_PROVIDER_SITE_OTHER): Payer: BC Managed Care – PPO | Admitting: Family Medicine

## 2012-03-29 ENCOUNTER — Encounter: Payer: Self-pay | Admitting: Family Medicine

## 2012-03-29 VITALS — BP 110/74 | HR 68 | Temp 97.7°F | Ht 64.5 in | Wt 146.0 lb

## 2012-03-29 DIAGNOSIS — M949 Disorder of cartilage, unspecified: Secondary | ICD-10-CM

## 2012-03-29 DIAGNOSIS — N63 Unspecified lump in unspecified breast: Secondary | ICD-10-CM

## 2012-03-29 DIAGNOSIS — M549 Dorsalgia, unspecified: Secondary | ICD-10-CM

## 2012-03-29 DIAGNOSIS — Z0000 Encounter for general adult medical examination without abnormal findings: Secondary | ICD-10-CM

## 2012-03-29 DIAGNOSIS — Z01419 Encounter for gynecological examination (general) (routine) without abnormal findings: Secondary | ICD-10-CM | POA: Insufficient documentation

## 2012-03-29 DIAGNOSIS — G43909 Migraine, unspecified, not intractable, without status migrainosus: Secondary | ICD-10-CM

## 2012-03-29 DIAGNOSIS — N959 Unspecified menopausal and perimenopausal disorder: Secondary | ICD-10-CM

## 2012-03-29 DIAGNOSIS — M899 Disorder of bone, unspecified: Secondary | ICD-10-CM

## 2012-03-29 MED ORDER — TRAMADOL HCL 50 MG PO TABS: ORAL_TABLET | ORAL | Status: DC

## 2012-03-29 MED ORDER — ESTRADIOL 0.1 MG/GM VA CREA: TOPICAL_CREAM | VAGINAL | Status: DC

## 2012-03-29 MED ORDER — CYCLOBENZAPRINE HCL 10 MG PO TABS: 10.0000 mg | ORAL_TABLET | Freq: Every day | ORAL | Status: DC | PRN

## 2012-03-29 MED ORDER — SUMATRIPTAN 20 MG/ACT NA SOLN: NASAL | Status: DC

## 2012-03-29 MED ORDER — HYDROCODONE-ACETAMINOPHEN 7.5-750 MG PO TABS: 1.0000 | ORAL_TABLET | Freq: Three times a day (TID) | ORAL | Status: DC | PRN

## 2012-03-29 NOTE — Progress Notes (Signed)
  Subjective:    Patient ID: Rebecca Cameron, female    DOB: [birth year], 67 y.o.   MRN: [id number]  HPI Marishka is a 67 year old single female nonsmoker still working in law-enforcement who comes in today for a Medicare wellness examination because of a history of chronic back pain, post menopausal vaginal dryness, osteopenia, migraine headaches  She states she's felt well no complaints.  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   Review of Systems  Constitutional: Negative.   HENT: Negative.   Eyes: Negative.   Respiratory: Negative.   Cardiovascular: Negative.   Gastrointestinal: Negative.   Genitourinary: Negative.   Musculoskeletal: Negative.   Neurological: Negative.   Psychiatric/Behavioral: Negative.        Objective:   Physical Exam  Constitutional: She appears well-developed and well-nourished.  HENT:  Head: Normocephalic and atraumatic.  Right Ear: External ear normal.  Left Ear: External ear normal.  Nose: Nose normal.  Mouth/Throat: Oropharynx is clear and moist.  Eyes: EOM are normal. Pupils are equal, round, and reactive to light.  Neck: Normal range of motion. Neck supple. No thyromegaly present.  Cardiovascular: Normal rate, regular rhythm, normal heart sounds and intact distal pulses.  Exam reveals no gallop and no friction rub.   No murmur heard. Pulmonary/Chest: Effort normal and breath sounds normal.  Abdominal: Soft. Bowel sounds are normal. She exhibits no distension and no  mass. There is no tenderness. There is no rebound.  Genitourinary: Vagina normal and uterus normal. Guaiac negative stool. No vaginal discharge found.  Bilateral breast exam normal  Musculoskeletal: Normal range of motion.  Lymphadenopathy:    She has no cervical adenopathy.  Neurological: She is alert. She has normal reflexes. No cranial nerve deficit. She exhibits normal muscle tone. Coordination normal.  Skin: Skin is warm and dry.  Total body skin exam normal she has some new seborrheic keratosis the largest one is the left breast around 6:00 at the periphery however it's not bothering her therefore we'll leave alone  Psychiatric: She has a normal mood and affect. Her behavior is normal. Judgment and thought content normal.          Assessment & Plan:  Healthy female  History of osteopenia continue exercise calcium and Forteo daily until July  Chronic low back pain secondary to degenerative disease Aleve one twice daily Flexeril Vicodin and/or tramadol when necessary for flareups of pain.  Migraine headaches continue Imitrex when necessary

## 2012-03-29 NOTE — Patient Instructions (Signed)
Continue your current medications  Followup in 1 year sooner if any problems 

## 2012-03-29 NOTE — Telephone Encounter (Signed)
Sue Lush from pharmacy called lost e-scribe for vaginal cream need order to be sent again. Rx resent to pharmacy.

## 2012-05-27 ENCOUNTER — Ambulatory Visit (INDEPENDENT_AMBULATORY_CARE_PROVIDER_SITE_OTHER): Payer: BC Managed Care – PPO | Admitting: Family Medicine

## 2012-05-27 ENCOUNTER — Encounter: Payer: Self-pay | Admitting: Family Medicine

## 2012-05-27 VITALS — BP 98/64 | Temp 97.8°F | Wt 149.0 lb

## 2012-05-27 DIAGNOSIS — M7021 Olecranon bursitis, right elbow: Secondary | ICD-10-CM | POA: Insufficient documentation

## 2012-05-27 DIAGNOSIS — M549 Dorsalgia, unspecified: Secondary | ICD-10-CM

## 2012-05-27 DIAGNOSIS — M7020 Olecranon bursitis, unspecified elbow: Secondary | ICD-10-CM

## 2012-05-27 NOTE — Progress Notes (Signed)
  Subjective:    Patient ID: Rebecca Cameron, female    DOB: [birth year], 67 y.o.   MRN: [id number]  HPIGloria is a 67 year old female who comes in today for evaluation of swelling of her right elbow  This has occurred gradually over the last couple weeks. No history of trauma.  She also has intermittent back pain  She is due to retire this summer and needs a note out of work from July 1 through the fourth    Review of Systems Review of systems otherwise negative    Objective:   Physical Exam  Well-developed well nourished female no acute distress examination of right elbow shows a golf ball size olecranon bursitis      Assessment & Plan:  Olecranon bursitis without consult when necessary  Low back pain note out of work

## 2012-05-27 NOTE — Patient Instructions (Signed)
If the bursitis becomes symptomatic then I would recommend Dr. Norlene Campbell or Albertha Ghee for consultation  Out of work July 1 to the fourth for low back pain  Return when necessary

## 2012-07-21 ENCOUNTER — Telehealth: Payer: Self-pay | Admitting: *Deleted

## 2012-07-21 NOTE — Telephone Encounter (Signed)
New dosage called into pharmacy

## 2012-07-26 ENCOUNTER — Encounter: Payer: Self-pay | Admitting: *Deleted

## 2012-07-30 ENCOUNTER — Telehealth: Payer: Self-pay | Admitting: Family Medicine

## 2012-07-30 NOTE — Telephone Encounter (Signed)
Pt has one more paper she needs signed.  Will drop by later today to see you.

## 2012-07-30 NOTE — Telephone Encounter (Signed)
Spoke with patient.

## 2012-09-15 ENCOUNTER — Encounter: Payer: Self-pay | Admitting: Internal Medicine

## 2012-09-15 ENCOUNTER — Telehealth: Payer: Self-pay | Admitting: Family Medicine

## 2012-09-15 ENCOUNTER — Ambulatory Visit (INDEPENDENT_AMBULATORY_CARE_PROVIDER_SITE_OTHER): Payer: Self-pay | Admitting: Internal Medicine

## 2012-09-15 VITALS — BP 122/82 | HR 69 | Temp 98.1°F | Wt 150.0 lb

## 2012-09-15 DIAGNOSIS — H938X1 Other specified disorders of right ear: Secondary | ICD-10-CM

## 2012-09-15 DIAGNOSIS — H938X9 Other specified disorders of ear, unspecified ear: Secondary | ICD-10-CM

## 2012-09-15 NOTE — Patient Instructions (Addendum)
Uncertain cause of your symptoms but your  Ear exam and  Heart exam is normal .

## 2012-09-15 NOTE — Telephone Encounter (Signed)
Patient Information:  Caller Name: [place]  Phone: [phone]  Patient: Rebecca Cameron, Rebecca Cameron  Gender: Female  DOB: 01-30-1946  Age: 67 Years  PCP: Kelle Darting [facility])  Office Follow Up:  Does the office need to follow up with this patient?: No  Instructions For The Office: N/A  RN Note:  Denies ear trauma or drainage. Uses Q-tip when gets out of shower.  Leaving to go out of town for 3 weeks.  Symptoms  Reason For Call & Symptoms: Intermittent right ear "feedback" or flutter/vibration in ear. Sympom began after temporary crown inserted.  Reviewed Health History In EMR: Yes  Reviewed Medications In EMR: Yes  Reviewed Allergies In EMR: Yes  Reviewed Surgeries / Procedures: Yes  Date of Onset of Symptoms: 09/01/2012  Guideline(s) Used:  Ear - Congestion  Disposition Per Guideline:   See Today in Office  Reason For Disposition Reached:   Patient wants to be seen  Advice Given:  N/A  Patient Will Follow Care Advice:  YES  Appointment Scheduled:  09/15/2012 16:00:00 Appointment Scheduled Provider:  Berniece Andreas (Family Practice)

## 2012-09-15 NOTE — Progress Notes (Signed)
Chief Complaint  Patient presents with  . Ear fluttering/vibrations    HPI: Patient comes in today for SDA for  new problem evaluation. Couple  of weeks ago had vibration in right ear. 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   Neg dental problem .   Crown put on and comes  and goes. In retrospect  Wondering if getting off balance but no falling  Like when getting up   Out of sorts.  Lasts seconds.  ? If equilibrium Going out of town for [employer].    So in to check.   gone Saturday  Gone for 3 months .   ROS: See pertinent positives and negatives per HPI. No fever tinnitus  Injury   Past Medical History  Diagnosis Date  . PMS (premenstrual syndrome)   . Migraine headache   . Osteopenia   . Compression fracture of spine, non-traumatic     Family History  Problem Relation Age of Onset  . Alzheimer's disease Mother   . Hip fracture Father   . Alcohol abuse Father   . Glaucoma Sister   . Alcohol abuse Brother     History   Social History  . Marital Status: Single    Spouse Name: N/A    Number of Children: N/A  . Years of Education: N/A   Social History Main Topics  . Smoking status: Never Smoker   . Smokeless tobacco: None  . Alcohol Use: Yes  . Drug Use: No  . Sexually Active:    Other Topics Concern  . None   Social History Narrative  . None    Outpatient Encounter Prescriptions as of 09/15/2012  Medication Sig Dispense Refill  . calcium-vitamin D 250-100 MG-UNIT per tablet Take 1 tablet by mouth 2 (two) times daily.        . cyclobenzaprine (FLEXERIL) 10 MG tablet Take 1 tablet (10 mg total) by mouth daily as needed.  50 tablet  3  . estradiol (ESTRACE VAGINAL) 0.1 MG/GM vaginal cream INSERT 4 GM VAGINALY TWICE WEEKLY  125.523 g  6  . FIBER SELECT GUMMIES PO Take by mouth.        . fish oil-omega-3 fatty acids 1000 MG capsule Take 1 g by mouth daily.        Marland Kitchen HYDROcodone-acetaminophen (NORCO) 7.5-325 MG per tablet Take 1 tablet by mouth every 8 (eight) hours as  needed for pain.      Marland Kitchen ibuprofen (ADVIL,MOTRIN) 600 MG tablet Take 600 mg by mouth every 6 (six) hours as needed.        . Multiple Vitamins-Minerals (ONE-A-DAY WOMENS 50 PLUS PO) Take by mouth.        . naproxen sodium (ANAPROX) 220 MG tablet Take 220 mg by mouth 2 (two) times daily with a meal.      . SUMAtriptan (IMITREX) 20 MG/ACT nasal spray UAD  1 Inhaler  6  . traMADol (ULTRAM) 50 MG tablet One half to one tablet twice daily for pain  60 tablet  4  . nabumetone (RELAFEN) 500 MG tablet TAKE 1 TABLET BY MOUTH TWICE A DAY  100 tablet  6   No facility-administered encounter medications on file as of 09/15/2012.    EXAM:  BP 122/82  Pulse 69  Temp(Src) 98.1 F (36.7 C) (Oral)  Wt 150 lb (68.04 kg)  BMI 25.36 kg/m2  SpO2 97%  Body mass index is 25.36 kg/(m^2).  GENERAL: vitals reviewed and listed above, alert, oriented, appears well hydrated  and in no acute distress  HEENT: atraumatic, conjunctiva  clear, no obvious abnormalities on inspection of external nose and ears tms in tact eacs clear right ? If small hair in canal.   No wax seen OP : no lesion edema or exudate  NECK: no obvious masses on inspection palpation  No bruits Neuro: non focal  Cn 3-12 nl   Nl gait  Cn see hearing screen 20 right 40 left but pt says heard sounds just didn't realize it was a the test.  MS: moves all extremities without noticeable focal  abnormality PSYCH: pleasant and cooperative, no obvious depression or anxiety  ASSESSMENT AND PLAN:  Discussed the following assessment and plan:  Abnormal ear sensation, right - uncertain cause no alarm features ? if small hair  in canal  otherwise no fb  expectant managment   Fu if  persistent or progressive at this time would not put any thing in ear  -Patient advised to return or notify health care team  if symptoms worsen or persist or new concerns arise.  Patient Instructions  Uncertain cause of your symptoms but your  Ear exam and  Heart exam is normal  .    Burna Mortimer K. Panosh M.D.

## 2013-02-23 ENCOUNTER — Telehealth: Payer: Self-pay | Admitting: Family Medicine

## 2013-02-23 NOTE — Telephone Encounter (Signed)
Patient is calling because she has a cough, no energy, no fever.  She has been taking Hydromet and it has helped the cough but she only has a little left.  She would like a refill of Hydromet and possibly a Rx for prednisone?  [facility]

## 2013-02-23 NOTE — Telephone Encounter (Signed)
Pt thinks she has bronchitis. Would like to speak w/ you. Battling this since cristmas, did not want to come in duue to all the sickness. pls call

## 2013-02-24 MED ORDER — HYDROCODONE-HOMATROPINE 5-1.5 MG/5ML PO SYRP: 5.0000 mL | ORAL_SOLUTION | Freq: Three times a day (TID) | ORAL | Status: DC | PRN

## 2013-02-24 MED ORDER — PREDNISONE 20 MG PO TABS: 20.0000 mg | ORAL_TABLET | Freq: Every day | ORAL | Status: DC

## 2013-02-24 NOTE — Telephone Encounter (Signed)
Okay per Dr Sherren Mocha. Prednisone sent to pharmacy and Rx ready for follow up and patient is aware.

## 2013-03-03 ENCOUNTER — Ambulatory Visit: Payer: BC Managed Care – PPO | Admitting: Family Medicine

## 2013-03-03 ENCOUNTER — Other Ambulatory Visit: Payer: Self-pay | Admitting: *Deleted

## 2013-03-03 MED ORDER — CLARITHROMYCIN 500 MG PO TABS: 500.0000 mg | ORAL_TABLET | Freq: Two times a day (BID) | ORAL | Status: DC

## 2013-03-03 MED ORDER — BENZONATATE 200 MG PO CAPS: 200.0000 mg | ORAL_CAPSULE | Freq: Three times a day (TID) | ORAL | Status: DC | PRN

## 2013-03-03 NOTE — Telephone Encounter (Signed)
Biaxin per Dr Sherren Mocha.   Rx sent and patient is aware.

## 2013-03-09 ENCOUNTER — Encounter: Payer: Self-pay | Admitting: *Deleted

## 2013-03-10 ENCOUNTER — Encounter: Payer: Self-pay | Admitting: Family Medicine

## 2013-03-10 ENCOUNTER — Ambulatory Visit (INDEPENDENT_AMBULATORY_CARE_PROVIDER_SITE_OTHER): Payer: Medicare Other | Admitting: Family Medicine

## 2013-03-10 VITALS — BP 130/78 | HR 78 | Temp 98.1°F | Wt 146.0 lb

## 2013-03-10 DIAGNOSIS — J329 Chronic sinusitis, unspecified: Secondary | ICD-10-CM

## 2013-03-10 DIAGNOSIS — R0982 Postnasal drip: Secondary | ICD-10-CM

## 2013-03-10 MED ORDER — DOXYCYCLINE HYCLATE 100 MG PO TABS: 100.0000 mg | ORAL_TABLET | Freq: Two times a day (BID) | ORAL | Status: DC

## 2013-03-10 MED ORDER — FLUTICASONE PROPIONATE 50 MCG/ACT NA SUSP: 2.0000 | Freq: Every day | NASAL | Status: DC

## 2013-03-10 NOTE — Progress Notes (Signed)
Chief Complaint  Patient presents with  . Dizziness    HPI:  Acute visit for URI: -started several weeks ago  -symptoms: cough , fatigue, malaise, diarrhea last week (none since and normal bowels now)and had dizziness x1 at the time - none since, now with maxillary sinus pain -tried: hydromet and rxd biaxin by PCP but stopped this as cause upset stomach -denies: fevers, tooth pain, ear pain, SOB, vomiting, persistent diarrhea  ROS: See pertinent positives and negatives per HPI.  Past Medical History  Diagnosis Date  . PMS (premenstrual syndrome)   . Migraine headache   . Osteopenia   . Compression fracture of spine, non-traumatic     Past Surgical History  Procedure Laterality Date  . Lumbar disc surgery      Family History  Problem Relation Age of Onset  . Alzheimer's disease Mother   . Hip fracture Father   . Alcohol abuse Father   . Glaucoma Sister   . Alcohol abuse Brother     History   Social History  . Marital Status: Single    Spouse Name: N/A    Number of Children: N/A  . Years of Education: N/A   Social History Main Topics  . Smoking status: Never Smoker   . Smokeless tobacco: None  . Alcohol Use: Yes  . Drug Use: No  . Sexual Activity:    Other Topics Concern  . None   Social History Narrative  . None    Current outpatient prescriptions:benzonatate (TESSALON) 200 MG capsule, Take 1 capsule (200 mg total) by mouth 3 (three) times daily as needed for cough., Disp: 30 capsule, Rfl: 0;  calcium-vitamin D 250-100 MG-UNIT per tablet, Take 1 tablet by mouth 2 (two) times daily.  , Disp: , Rfl: ;  clarithromycin (BIAXIN) 500 MG tablet, Take 1 tablet (500 mg total) by mouth 2 (two) times daily., Disp: 20 tablet, Rfl: 0 cyclobenzaprine (FLEXERIL) 10 MG tablet, Take 1 tablet (10 mg total) by mouth daily as needed., Disp: 50 tablet, Rfl: 3;  estradiol (ESTRACE VAGINAL) 0.1 MG/GM vaginal cream, INSERT 4 GM VAGINALY TWICE WEEKLY, Disp: 125.523 g, Rfl: 6;  FIBER  SELECT GUMMIES PO, Take by mouth.  , Disp: , Rfl: ;  fish oil-omega-3 fatty acids 1000 MG capsule, Take 1 g by mouth daily.  , Disp: , Rfl:  HYDROcodone-acetaminophen (NORCO) 7.5-325 MG per tablet, Take 1 tablet by mouth every 8 (eight) hours as needed for pain., Disp: , Rfl: ;  HYDROcodone-homatropine (HYCODAN) 5-1.5 MG/5ML syrup, Take 5 mLs by mouth 3 (three) times daily as needed for cough., Disp: 180 mL, Rfl: 0;  ibuprofen (ADVIL,MOTRIN) 600 MG tablet, Take 600 mg by mouth every 6 (six) hours as needed.  , Disp: , Rfl:  Multiple Vitamins-Minerals (ONE-A-DAY WOMENS 50 PLUS PO), Take by mouth.  , Disp: , Rfl: ;  nabumetone (RELAFEN) 500 MG tablet, TAKE 1 TABLET BY MOUTH TWICE A DAY, Disp: 100 tablet, Rfl: 6;  naproxen sodium (ANAPROX) 220 MG tablet, Take 220 mg by mouth 2 (two) times daily with a meal., Disp: , Rfl: ;  predniSONE (DELTASONE) 20 MG tablet, Take 1 tablet (20 mg total) by mouth daily with breakfast., Disp: 30 tablet, Rfl: 1 SUMAtriptan (IMITREX) 20 MG/ACT nasal spray, UAD, Disp: 1 Inhaler, Rfl: 6;  traMADol (ULTRAM) 50 MG tablet, One half to one tablet twice daily for pain, Disp: 60 tablet, Rfl: 4;  doxycycline (VIBRA-TABS) 100 MG tablet, Take 1 tablet (100 mg total) by mouth 2 (  two) times daily., Disp: 20 tablet, Rfl: 0;  fluticasone (FLONASE) 50 MCG/ACT nasal spray, Place 2 sprays into both nostrils daily., Disp: 16 g, Rfl: 0  EXAM:  Filed Vitals:   03/10/13 0945  BP: 130/78  Pulse: 78  Temp: 98.1 F (36.7 C)    Body mass index is 24.68 kg/(m^2).  GENERAL: vitals reviewed and listed above, alert, oriented, appears well hydrated and in no acute distress  HEENT: atraumatic, conjunttiva clear, no obvious abnormalities on inspection of external nose and ears  NECK: no obvious masses on inspection  LUNGS: clear to auscultation bilaterally, no wheezes, rales or rhonchi, good air movement  CV: HRRR, no peripheral edema  MS: moves all extremities without noticeable  abnormality  PSYCH: pleasant and cooperative, no obvious depression or anxiety  ASSESSMENT AND PLAN:  Discussed the following assessment and plan:  Sinusitis - Plan: doxycycline (VIBRA-TABS) 100 MG tablet  PND (post-nasal drip) - Plan: fluticasone (FLONASE) 50 MCG/ACT nasal spray  -we discussed possible serious and likely etiologies, workup and treatment, treatment risks and return precautions -after this discussion, Cayci opted for tx with doxy for likely sinusitis and flonase for persistent PND -follow up advised as needed -of course, we advised Aerica  to return or notify a doctor immediately if symptoms worsen or persist or new concerns arise.  .  -Patient advised to return or notify a doctor immediately if symptoms worsen or persist or new concerns arise.  Patient Instructions  -plenty of fluids  -take course of doxycycline and use flonase for 21 days  -plenty of rest and fluids  -nasal saline wash 2-3 times daily (use prepackaged nasal saline or bottled/distilled water if making your own)   -can use sinex nasal spray for drainage and nasal congestion - but do NOT use longer then 3-4 days  -can use tylenol or ibuprofen as directed for aches and sorethroat  -in the winter time, using a humidifier at night is helpful (please follow cleaning instructions)  -if you are taking a cough medication - use only as directed, may also try a teaspoon of honey to coat the throat and throat lozenges  -for sore throat, salt water gargles can help  -follow up if you have fevers, facial pain, tooth pain, diarrhea, difficulty breathing or are worsening or not getting better in 5-7 days      KIM, HANNAH R.

## 2013-03-10 NOTE — Patient Instructions (Signed)
-  plenty of fluids  -take course of doxycycline and use flonase for 21 days  -plenty of rest and fluids  -nasal saline wash 2-3 times daily (use prepackaged nasal saline or bottled/distilled water if making your own)   -can use sinex nasal spray for drainage and nasal congestion - but do NOT use longer then 3-4 days  -can use tylenol or ibuprofen as directed for aches and sorethroat  -in the winter time, using a humidifier at night is helpful (please follow cleaning instructions)  -if you are taking a cough medication - use only as directed, may also try a teaspoon of honey to coat the throat and throat lozenges  -for sore throat, salt water gargles can help  -follow up if you have fevers, facial pain, tooth pain, diarrhea, difficulty breathing or are worsening or not getting better in 5-7 days

## 2013-04-21 ENCOUNTER — Other Ambulatory Visit (INDEPENDENT_AMBULATORY_CARE_PROVIDER_SITE_OTHER): Payer: Medicare Other

## 2013-04-21 DIAGNOSIS — G43909 Migraine, unspecified, not intractable, without status migrainosus: Secondary | ICD-10-CM

## 2013-04-21 DIAGNOSIS — Z136 Encounter for screening for cardiovascular disorders: Secondary | ICD-10-CM

## 2013-04-21 DIAGNOSIS — Z0000 Encounter for general adult medical examination without abnormal findings: Secondary | ICD-10-CM

## 2013-04-21 DIAGNOSIS — M549 Dorsalgia, unspecified: Secondary | ICD-10-CM

## 2013-04-21 LAB — CBC WITH DIFFERENTIAL/PLATELET
BASOS PCT: 1.1 % (ref 0.0–3.0)
Basophils Absolute: 0 10*3/uL (ref 0.0–0.1)
EOS PCT: 2.3 % (ref 0.0–5.0)
Eosinophils Absolute: 0.1 10*3/uL (ref 0.0–0.7)
HCT: 41.3 % (ref 36.0–46.0)
Hemoglobin: 13.6 g/dL (ref 12.0–15.0)
LYMPHS ABS: 1.3 10*3/uL (ref 0.7–4.0)
Lymphocytes Relative: 30.5 % (ref 12.0–46.0)
MCHC: 32.9 g/dL (ref 30.0–36.0)
MCV: 96.1 fl (ref 78.0–100.0)
MONO ABS: 0.5 10*3/uL (ref 0.1–1.0)
Monocytes Relative: 10.6 % (ref 3.0–12.0)
Neutro Abs: 2.4 10*3/uL (ref 1.4–7.7)
Neutrophils Relative %: 55.5 % (ref 43.0–77.0)
Platelets: 202 10*3/uL (ref 150.0–400.0)
RBC: 4.3 Mil/uL (ref 3.87–5.11)
RDW: 14.1 % (ref 11.5–14.6)
WBC: 4.3 10*3/uL — AB (ref 4.5–10.5)

## 2013-04-21 LAB — LIPID PANEL
CHOLESTEROL: 235 mg/dL — AB (ref 0–200)
HDL: 78.7 mg/dL (ref >39.00)
LDL Cholesterol: 140 mg/dL — ABNORMAL HIGH (ref 0–99)
Total CHOL/HDL Ratio: 3
Triglycerides: 80 mg/dL (ref 0.0–149.0)
VLDL: 16 mg/dL (ref 0.0–40.0)

## 2013-04-21 LAB — HEPATIC FUNCTION PANEL
ALBUMIN: 4 g/dL (ref 3.5–5.2)
ALT: 20 U/L (ref 0–35)
AST: 20 U/L (ref 0–37)
Alkaline Phosphatase: 47 U/L (ref 39–117)
Bilirubin, Direct: 0 mg/dL (ref 0.0–0.3)
TOTAL PROTEIN: 7.1 g/dL (ref 6.0–8.3)
Total Bilirubin: 1 mg/dL (ref 0.3–1.2)

## 2013-04-21 LAB — BASIC METABOLIC PANEL
BUN: 15 mg/dL (ref 6–23)
CHLORIDE: 111 meq/L (ref 96–112)
CO2: 26 mEq/L (ref 19–32)
Calcium: 9.7 mg/dL (ref 8.4–10.5)
Creatinine, Ser: 1.1 mg/dL (ref 0.4–1.2)
GFR: 55.63 mL/min — ABNORMAL LOW (ref >60.00)
Glucose, Bld: 93 mg/dL (ref 70–99)
POTASSIUM: 5.6 meq/L — AB (ref 3.5–5.1)
Sodium: 144 mEq/L (ref 135–145)

## 2013-04-21 LAB — TSH: TSH: 1.94 u[IU]/mL (ref 0.35–5.50)

## 2013-04-22 LAB — POCT URINALYSIS DIPSTICK
Bilirubin, UA: NEGATIVE
Blood, UA: NEGATIVE
Glucose, UA: NEGATIVE
Ketones, UA: NEGATIVE
LEUKOCYTES UA: NEGATIVE
NITRITE UA: NEGATIVE
PH UA: 5
PROTEIN UA: NEGATIVE
Spec Grav, UA: 1.015
UROBILINOGEN UA: 0.2

## 2013-04-28 ENCOUNTER — Encounter: Payer: BC Managed Care – PPO | Admitting: Family Medicine

## 2013-05-05 ENCOUNTER — Encounter: Payer: Self-pay | Admitting: Family Medicine

## 2013-05-05 ENCOUNTER — Ambulatory Visit (INDEPENDENT_AMBULATORY_CARE_PROVIDER_SITE_OTHER): Payer: Medicare Other | Admitting: Family Medicine

## 2013-05-05 VITALS — BP 130/85 | Temp 97.7°F | Ht 65.0 in | Wt 150.0 lb

## 2013-05-05 DIAGNOSIS — N959 Unspecified menopausal and perimenopausal disorder: Secondary | ICD-10-CM

## 2013-05-05 DIAGNOSIS — Z23 Encounter for immunization: Secondary | ICD-10-CM

## 2013-05-05 DIAGNOSIS — G43909 Migraine, unspecified, not intractable, without status migrainosus: Secondary | ICD-10-CM

## 2013-05-05 DIAGNOSIS — M549 Dorsalgia, unspecified: Secondary | ICD-10-CM

## 2013-05-05 MED ORDER — SUMATRIPTAN 20 MG/ACT NA SOLN: NASAL | Status: DC

## 2013-05-05 MED ORDER — ESTRADIOL 0.1 MG/GM VA CREA: TOPICAL_CREAM | VAGINAL | Status: DC

## 2013-05-05 MED ORDER — TRAMADOL HCL 50 MG PO TABS: ORAL_TABLET | ORAL | Status: DC

## 2013-05-05 NOTE — Patient Instructions (Signed)
Continue daily exercise  Because of your history of bone problems I would take calcium and vitamin D daily  Use the hormonal cream to 3 times weekly  Followup in 1 year sooner if any problems

## 2013-05-05 NOTE — Progress Notes (Signed)
   Subjective:    Patient ID: Rebecca Cameron, female    DOB: [birth year], 68 y.o.   MRN: [id number]  HPI Rebecca Cameron is a 68 year old female who comes in today for a Medicare wellness examination  She uses hormonal cream 3 times weekly for vaginal dryness and Aleve one twice daily for joint pain  She gets routine eye care, dental care, BSE monthly, and you mammography, colonoscopy up-to-date as are the vaccinations  Cognitive function normal she exercises on a daily basis home health safety reviewed no issues identified, no guns in the house, she does have a health care power of attorney and living well.  Retired from the [employer]: Negative.   HENT: Negative.   Eyes: Negative.   Respiratory: Negative.   Cardiovascular: Negative.   Gastrointestinal: Negative.   Genitourinary: Negative.   Musculoskeletal: Negative.   Neurological: Negative.   Psychiatric/Behavioral: Negative.        Objective:   Physical Exam  Nursing note and vitals reviewed. Constitutional: She appears well-developed and well-nourished.  HENT:  Head: Normocephalic and atraumatic.  Right Ear: External ear normal.  Left Ear: External ear normal.  Nose: Nose normal.  Mouth/Throat: Oropharynx is clear and moist.  Eyes: EOM are normal. Pupils are equal, round, and reactive to light.  Neck: Normal range of motion. Neck supple. No thyromegaly present.  Cardiovascular: Normal rate, regular rhythm, normal heart sounds and intact distal pulses.  Exam reveals no gallop and no friction rub.   No murmur heard. Pulmonary/Chest: Effort normal and breath sounds normal.  Abdominal: Soft. Bowel sounds are normal. She exhibits no distension and no mass. There is no tenderness. There is no rebound.  Genitourinary:  Bilateral breast exam normal  Asymptomatic no previous history of abnormal Paps that were Paps every 3 years  Musculoskeletal: Normal range of motion.    Lymphadenopathy:    She has no cervical adenopathy.  Neurological: She is alert. She has normal reflexes. No cranial nerve deficit. She exhibits normal muscle tone. Coordination normal.  Skin: Skin is warm and dry.  Psychiatric: She has a normal mood and affect. Her behavior is normal. Judgment and thought content normal.          Assessment & Plan:  Healthy female  Postmenopausal vaginal dryness continue hormonal cream  Osteopenia continue exercise

## 2013-05-05 NOTE — Progress Notes (Signed)
Pre visit review using our clinic review tool, if applicable. No additional management support is needed unless otherwise documented below in the visit note. 

## 2013-07-26 ENCOUNTER — Encounter: Payer: Self-pay | Admitting: Family Medicine

## 2013-07-26 ENCOUNTER — Ambulatory Visit (INDEPENDENT_AMBULATORY_CARE_PROVIDER_SITE_OTHER): Payer: Medicare Other | Admitting: Family Medicine

## 2013-07-26 ENCOUNTER — Ambulatory Visit (INDEPENDENT_AMBULATORY_CARE_PROVIDER_SITE_OTHER)
Admission: RE | Admit: 2013-07-26 | Discharge: 2013-07-26 | Disposition: A | Discharge status: Home / Self Care | Payer: Medicare Other | Source: Ambulatory Visit | Attending: Family Medicine | Admitting: Family Medicine

## 2013-07-26 VITALS — BP 124/80 | Temp 98.2°F | Wt 145.0 lb

## 2013-07-26 DIAGNOSIS — J45909 Unspecified asthma, uncomplicated: Secondary | ICD-10-CM

## 2013-07-26 MED ORDER — HYDROCODONE-HOMATROPINE 5-1.5 MG/5ML PO SYRP
5.0000 mL | ORAL_SOLUTION | Freq: Three times a day (TID) | ORAL | Status: DC | PRN
Start: 2013-07-26 — End: 2014-05-01

## 2013-07-26 MED ORDER — PREDNISONE 20 MG PO TABS: ORAL_TABLET | ORAL | Status: DC

## 2013-07-26 NOTE — Patient Instructions (Signed)
Prednisone 20 mg,,,,,,,, 2 tabs until you're well then taper as outlined  Drink lots of water  Hydromet,,,,,,,,,,, 1/2-1 teaspoon 3 times daily. For cough  Go to the main office now for your chest x-ray  Followup in one week

## 2013-07-26 NOTE — Progress Notes (Signed)
   Subjective:    Patient ID: BERLIN VIERECK, female    DOB: [birth year], 68 y.o.   MRN: [id number]  HPI Griselda is a 68 year old single female nonsmoker who comes in today for evaluation of a cough for 6 weeks  He said sugar cough [place] had bronchitis was given a shot of steroids her breathing treatment and Cipro and Z-Pak. None of which didn't a good. She's still coughing.  She has a history of reactive airway disease  She also has vaginitis from the antibiotics   Review of Systems Review of systems otherwise negative    Objective:   Physical Exam Well-developed well-nourished female no acute distress HEENT negative neck was supple no adenopathy lungs are clear except for symmetrical mild to moderate late expiratory wheezing on forced expiration       Assessment & Plan:  Asthma,,,,,,,,, chest x-ray rule out underlying pulmonary pathology,,,,,, treat with prednisone as before  Vaginitis,,,, OTC Monistat,,,,, Diflucan when necessary,

## 2013-07-26 NOTE — Progress Notes (Signed)
Pre visit review using our clinic review tool, if applicable. No additional management support is needed unless otherwise documented below in the visit note. 

## 2013-08-02 ENCOUNTER — Encounter: Payer: Self-pay | Admitting: Family Medicine

## 2013-08-02 ENCOUNTER — Ambulatory Visit (INDEPENDENT_AMBULATORY_CARE_PROVIDER_SITE_OTHER): Payer: Medicare Other | Admitting: Family Medicine

## 2013-08-02 VITALS — BP 120/80 | HR 78 | Temp 98.5°F | Wt 146.0 lb

## 2013-08-02 DIAGNOSIS — J45909 Unspecified asthma, uncomplicated: Secondary | ICD-10-CM

## 2013-08-02 NOTE — Progress Notes (Signed)
   Subjective:    Patient ID: Rebecca Cameron, female    DOB: 08-Aug-1945, 68 y.o.   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 She still coughing. She's taking the cough syrup half a teaspoon 3 times daily   Review of Systems Review of systems negative chest x-ray a week ago normal    Objective:   Physical Exam  Well-developed well-nourished female no acute distress vital signs stable she's afebrile pulse ox on room air 93%. HEENT negative neck was supple no adenopathy lungs are clear      Assessment & Plan:  Persistent cough etiology unknown,,,,,,,,,,,,,,, refer to pulmonary for evaluation,,,,,,, add Prilosec in the meantime question asymptomatic reflux,

## 2013-08-02 NOTE — Patient Instructions (Signed)
OTC omeprazole........... one twice daily  Continue to taper the prednisone  1/2-1 teaspoon of cough syrup at bedtime  Hour request a pulmonary consult ASAP

## 2013-08-02 NOTE — Progress Notes (Signed)
Pre visit review using our clinic review tool, if applicable. No additional management support is needed unless otherwise documented below in the visit note. 

## 2013-08-03 ENCOUNTER — Institutional Professional Consult (permissible substitution): Payer: Medicare Other | Admitting: Internal Medicine

## 2013-08-16 ENCOUNTER — Ambulatory Visit (INDEPENDENT_AMBULATORY_CARE_PROVIDER_SITE_OTHER): Payer: Medicare Other | Admitting: Pulmonary Disease

## 2013-08-16 ENCOUNTER — Encounter: Payer: Self-pay | Admitting: Pulmonary Disease

## 2013-08-16 VITALS — BP 134/74 | HR 77 | Ht 66.0 in | Wt 146.0 lb

## 2013-08-16 DIAGNOSIS — K219 Gastro-esophageal reflux disease without esophagitis: Secondary | ICD-10-CM | POA: Insufficient documentation

## 2013-08-16 DIAGNOSIS — R05 Cough: Secondary | ICD-10-CM | POA: Insufficient documentation

## 2013-08-16 DIAGNOSIS — R062 Wheezing: Secondary | ICD-10-CM

## 2013-08-16 DIAGNOSIS — R059 Cough, unspecified: Secondary | ICD-10-CM

## 2013-08-16 NOTE — Patient Instructions (Signed)
You need to try to suppress your cough to allow your larynx (voice box) to heal.  For three days don't talk, laugh, sing, or clear your throat. Do everything you can to suppress the cough during this time. Use hard candies (sugarless Jolly Ranchers) or non-mint or non-menthol containing cough drops during this time to soothe your throat.  Use a cough suppressant (tramadol 50mg  every 6 hours) around the clock during this time.  After three days, gradually increase the use of your voice and back off on the cough suppressants.  When you get a cold with cough, use the following: For sinuses: Use Neil Med rinses with distilled water at least twice per day using the instructions on the package. 1/2 hour after using the [facility] Med rinse, use Nasacort two puffs in each nostril once per day.  Remember that the Nasacort can take 1-2 weeks to work after regular use. Use generic zyrtec (cetirizine) every day.  If this doesn't help, then stop taking it and use chlorpheniramine-phenylephrine combination tablets.  For acid reflux: Use over the counter omeprazole  For cough suppression: Use delsym

## 2013-08-16 NOTE — Progress Notes (Signed)
Subjective:    Patient ID: Rebecca Cameron, female    DOB: 1945-08-31, 68 y.o.   MRN: [id number]  HPI  This is a very pleasant 68 y/o female who has come to see me today for evaluation of cough. She has no past history significant for lung disease, never had respiratory problems as a child, and never smoked. She has been very active throughout her life. She did work in a [employer] and safety [employer] when she was in her early 52s for about 4 years. 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 She remains active on a daily basis with exercise and various projects around her house.  Every year she has had repeated episodes of bronchitis. She tells me that the most recent 2 episodes have been more severe. Specifically, since January she has had 2 separate episodes of prolonged cough, wheezing, with some mild shortness of breath. The most recent episode happened about 4 weeks ago. She had been helping a team worked on a deck on her house. This apparently was a very dusty, hot environment and there was a lot of pollen out at the time. She said that she started having fatigue, postnasal drip, cough, shortness of breath, and wheezing. She was seen at the nursing care about 3 or 4 days into this illness and she was given a steroid shot but this did not help. She then went to her primary care physician who treated her with a prednisone taper which did help significantly. She was seen by him again about a week after that and some of the cough was persisting so he recommended that he see [place] and she started a proton pump inhibitor.  She says that the cough still persists somewhat but it is significantly improved. She cannot tell if the proton pump inhibitor made much of a difference. She denies heartburn or ongoing postnasal drip. She did note postnasal drip was significant at the beginning of the illness however. She does not have shortness of breath and she has resumed her regular activity. The  cough is typically worse on days when she talks more and uses her voice more frequently. It is also worse when she lies flat. It is typically associated with hoarseness and a scratchy throat.  Past Medical History  Diagnosis Date  . PMS (premenstrual syndrome)   . Migraine headache   . Osteopenia   . Compression fracture of spine, non-traumatic      Family History  Problem Relation Age of Onset  . Alzheimer's disease Mother   . Hip fracture Father   . Alcohol abuse Father   . Glaucoma Sister   . Alcohol abuse Brother      History   Social History  . Marital Status: Single    Spouse Name: N/A    Number of Children: N/A  . Years of Education: N/A   Occupational History  . Not on file.   Social History Main Topics  . Smoking status: Never Smoker   . Smokeless tobacco: Never Used  . Alcohol Use: 1.0 oz/week    2 drink(s) per week  . Drug Use: No  . Sexual Activity: Not on file   Other Topics Concern  . Not on file   Social History Narrative  . No narrative on file     No Known Allergies   Outpatient Prescriptions Prior to Visit  Medication Sig Dispense Refill  . calcium-vitamin D 250-100 MG-UNIT per tablet Take 1 tablet  by mouth 2 (two) times daily.        . cyclobenzaprine (FLEXERIL) 10 MG tablet Take 1 tablet (10 mg total) by mouth daily as needed.  50 tablet  3  . estradiol (ESTRACE VAGINAL) 0.1 MG/GM vaginal cream INSERT 4 GM VAGINALY TWICE WEEKLY  125.523 g  6  . FIBER SELECT GUMMIES PO Take by mouth.        . fish oil-omega-3 fatty acids 1000 MG capsule Take 1 g by mouth daily.        Marland Kitchen HYDROcodone-homatropine (HYCODAN) 5-1.5 MG/5ML syrup Take 5 mLs by mouth every 8 (eight) hours as needed for cough.  240 mL  0  . ibuprofen (ADVIL,MOTRIN) 600 MG tablet Take 600 mg by mouth every 6 (six) hours as needed.        . Multiple Vitamins-Minerals (ONE-A-DAY WOMENS 50 PLUS PO) Take by mouth.        . SUMAtriptan (IMITREX) 20 MG/ACT nasal spray UAD  3 Inhaler  6  .  traMADol (ULTRAM) 50 MG tablet One half to one tablet twice daily for pain  60 tablet  4  . nabumetone (RELAFEN) 500 MG tablet TAKE 1 TABLET BY MOUTH TWICE A DAY  100 tablet  6  . predniSONE (DELTASONE) 20 MG tablet 2 tabs x 3 days, 1 tab x 3 days, 1/2 tab x 3 days, 1/2 tab M,W,F x 2 weeks  40 tablet  1   No facility-administered medications prior to visit.      Review of Systems  Constitutional: Positive for fatigue. Negative for fever and unexpected weight change.  HENT: Positive for congestion. Negative for dental problem, ear pain, nosebleeds, postnasal drip, rhinorrhea, sinus pressure, sneezing, sore throat and trouble swallowing.   Eyes: Negative for redness and itching.  Respiratory: Positive for cough, shortness of breath and wheezing. Negative for chest tightness.   Cardiovascular: Negative for palpitations and leg swelling.  Gastrointestinal: Negative for nausea and vomiting.  Genitourinary: Negative for dysuria.  Musculoskeletal: Negative for joint swelling.  Skin: Negative for rash.  Neurological: Negative for headaches.  Hematological: Does not bruise/bleed easily.  Psychiatric/Behavioral: Negative for dysphoric mood. The patient is not nervous/anxious.        Objective:   Physical Exam Filed Vitals:   08/16/13 1557  BP: 134/74  Pulse: 77  Height: 5\' 6"  (1.676 m)  Weight: 146 lb (66.225 kg)  SpO2: 98%   RA  Gen: well appearing, no acute distress HEENT: NCAT, PERRL, EOMi, OP clear, neck supple without masses PULM: CTA B CV: RRR, no mgr, no JVD AB: BS+, soft, nontender, no hsm Ext: warm, no edema, no clubbing, no cyanosis Derm: no rash or skin breakdown Neuro: A&Ox4, CN II-XII intact, strength 5/5 in all 4 extremities      Assessment & Plan:   Cough Today her lung exam, ambulatory oximetry, simple spirometry in my review of her chest radiograph were all normal. Based on this I do not see evidence of underlying lung disease. I believe that her cough is  mostly due to laryngeal irritation. However, should the cough not improved with voice rest then we can certainly perform for testing to evaluate her lungs more thoroughly.  I believe that the cough is due primarily to laryngeal irritation from ongoing coughing, and likely some acid reflux. When she has postnasal drip I think this also contributes as well. The best thing that can help her at this point his voice rest.  Plan: -Voice rest encouraged -Advised on  proper use of tramadol (she has some at home) for about 3 days to suppress the cough -I gave her advice on how to treat postnasal drip when it occurs -Use omeprazole continuously when she has the cough -Followup with me in no improvement in about 6 weeks.  GERD (gastroesophageal reflux disease) Because the cough seems to worsen when lying flat and has improved somewhat over the last few weeks and she's been on the proton pump inhibitor think it's possible she could have acid reflux. She denies heartburn symptoms and she's skeptical about the omeprazole as she thinks the prednisone helped more.  In the future I don't think she needs to necessarily take a proton pump inhibitor every day. However, when she gets a cold I advised her that it would be best to take a PPI to keep the cough from last in for several weeks.    Updated Medication List Outpatient Encounter Prescriptions as of 08/16/2013  Medication Sig  . calcium-vitamin D 250-100 MG-UNIT per tablet Take 1 tablet by mouth 2 (two) times daily.    . cyclobenzaprine (FLEXERIL) 10 MG tablet Take 1 tablet (10 mg total) by mouth daily as needed.  Marland Kitchen estradiol (ESTRACE VAGINAL) 0.1 MG/GM vaginal cream INSERT 4 GM VAGINALY TWICE WEEKLY  . FIBER SELECT GUMMIES PO Take by mouth.    . fish oil-omega-3 fatty acids 1000 MG capsule Take 1 g by mouth daily.    Marland Kitchen HYDROcodone-homatropine (HYCODAN) 5-1.5 MG/5ML syrup Take 5 mLs by mouth every 8 (eight) hours as needed for cough.  Marland Kitchen ibuprofen  (ADVIL,MOTRIN) 600 MG tablet Take 600 mg by mouth every 6 (six) hours as needed.    . Multiple Vitamins-Minerals (ONE-A-DAY WOMENS 50 PLUS PO) Take by mouth.    Marland Kitchen omeprazole (PRILOSEC) 20 MG capsule Take 20 mg by mouth daily.  . SUMAtriptan (IMITREX) 20 MG/ACT nasal spray UAD  . traMADol (ULTRAM) 50 MG tablet One half to one tablet twice daily for pain  . [DISCONTINUED] nabumetone (RELAFEN) 500 MG tablet TAKE 1 TABLET BY MOUTH TWICE A DAY  . [DISCONTINUED] predniSONE (DELTASONE) 20 MG tablet 2 tabs x 3 days, 1 tab x 3 days, 1/2 tab x 3 days, 1/2 tab M,W,F x 2 weeks

## 2013-08-16 NOTE — Assessment & Plan Note (Signed)
Today her lung exam, ambulatory oximetry, simple spirometry in my review of her chest radiograph were all normal. Based on this I do not see evidence of underlying lung disease. I believe that her cough is mostly due to laryngeal irritation. However, should the cough not improved with voice rest then we can certainly perform for testing to evaluate her lungs more thoroughly.  I believe that the cough is due primarily to laryngeal irritation from ongoing coughing, and likely some acid reflux. When she has postnasal drip I think this also contributes as well. The best thing that can help her at this point his voice rest.  Plan: -Voice rest encouraged -Advised on proper use of tramadol (she has some at home) for about 3 days to suppress the cough -I gave her advice on how to treat postnasal drip when it occurs -Use omeprazole continuously when she has the cough -Followup with me in no improvement in about 6 weeks.

## 2013-08-16 NOTE — Assessment & Plan Note (Signed)
Because the cough seems to worsen when lying flat and has improved somewhat over the last few weeks and she's been on the proton pump inhibitor think it's possible she could have acid reflux. She denies heartburn symptoms and she's skeptical about the omeprazole as she thinks the prednisone helped more.  In the future I don't think she needs to necessarily take a proton pump inhibitor every day. However, when she gets a cold I advised her that it would be best to take a PPI to keep the cough from last in for several weeks.

## 2014-03-23 ENCOUNTER — Encounter: Payer: Self-pay | Admitting: Internal Medicine

## 2014-03-29 ENCOUNTER — Encounter: Payer: Self-pay | Admitting: Internal Medicine

## 2014-05-01 ENCOUNTER — Ambulatory Visit (AMBULATORY_SURGERY_CENTER): Payer: Self-pay | Admitting: *Deleted

## 2014-05-01 VITALS — Ht 66.0 in | Wt 149.0 lb

## 2014-05-01 DIAGNOSIS — Z1211 Encounter for screening for malignant neoplasm of colon: Secondary | ICD-10-CM

## 2014-05-01 MED ORDER — MOVIPREP 100 G PO SOLR: ORAL | Status: DC

## 2014-05-01 NOTE — Progress Notes (Signed)
Patient denies any allergies to eggs or soy. Patient denies any problems with anesthesia/sedation. Patient denies any oxygen use at home and does not take any diet/weight loss medications. EMMI education assisgned to patient on colonoscopy, this was explained and instructions given to patient. 

## 2014-05-02 ENCOUNTER — Other Ambulatory Visit: Payer: Medicare Other

## 2014-05-08 ENCOUNTER — Encounter: Payer: Medicare Other | Admitting: Family Medicine

## 2014-05-15 ENCOUNTER — Encounter: Payer: Self-pay | Admitting: Internal Medicine

## 2014-05-15 ENCOUNTER — Ambulatory Visit (AMBULATORY_SURGERY_CENTER): Payer: Medicare Other | Admitting: Internal Medicine

## 2014-05-15 VITALS — BP 110/62 | HR 64 | Temp 97.9°F | Resp 14 | Ht 66.0 in | Wt 149.0 lb

## 2014-05-15 DIAGNOSIS — Z1211 Encounter for screening for malignant neoplasm of colon: Secondary | ICD-10-CM | POA: Diagnosis not present

## 2014-05-15 MED ORDER — SODIUM CHLORIDE 0.9 % IV SOLN: 500.0000 mL | INTRAVENOUS | Status: DC

## 2014-05-15 NOTE — Progress Notes (Signed)
A/ox3 pleased with MAC, report to Wendy RN 

## 2014-05-15 NOTE — Patient Instructions (Signed)
YOU HAD AN ENDOSCOPIC PROCEDURE TODAY AT THE [facility]:   Refer to the procedure report that was given to you for any specific questions about what was found during the examination.  If the procedure report does not answer your questions, please call your gastroenterologist to clarify.  If you requested that your care partner not be given the details of your procedure findings, then the procedure report has been included in a sealed envelope for you to review at your convenience later.  YOU SHOULD EXPECT: Some feelings of bloating in the abdomen. Passage of more gas than usual.  Walking can help get rid of the air that was put into your GI tract during the procedure and reduce the bloating. If you had a lower endoscopy (such as a colonoscopy or flexible sigmoidoscopy) you may notice spotting of blood in your stool or on the toilet paper. If you underwent a bowel prep for your procedure, you may not have a normal bowel movement for a few days.  Please Note:  You might notice some irritation and congestion in your nose or some drainage.  This is from the oxygen used during your procedure.  There is no need for concern and it should clear up in a day or so.  SYMPTOMS TO REPORT IMMEDIATELY:   Following lower endoscopy (colonoscopy or flexible sigmoidoscopy):  Excessive amounts of blood in the stool  Significant tenderness or worsening of abdominal pains  Swelling of the abdomen that is new, acute  Fever of 100F or higher  For urgent or emergent issues, a gastroenterologist can be reached at any hour by calling [phone].   DIET: Your first meal following the procedure should be a small meal and then it is ok to progress to your normal diet. Heavy or fried foods are harder to digest and may make you feel nauseous or bloated.  Likewise, meals heavy in dairy and vegetables can increase bloating.  Drink plenty of fluids but you should avoid alcoholic beverages for 24  hours.  ACTIVITY:  You should plan to take it easy for the rest of today and you should NOT DRIVE or use heavy machinery until tomorrow (because of the sedation medicines used during the test).    FOLLOW UP: Our staff will call the number listed on your records the next business day following your procedure to check on you and address any questions or concerns that you may have regarding the information given to you following your procedure. If we do not reach you, we will leave a message.  However, if you are feeling well and you are not experiencing any problems, there is no need to return our call.  We will assume that you have returned to your regular daily activities without incident.  If any biopsies were taken you will be contacted by phone or by letter within the next 1-3 weeks.  Please call us at [phone] if you have not heard about the biopsies in 3 weeks.    SIGNATURES/CONFIDENTIALITY: You and/or your care partner have signed paperwork which will be entered into your electronic medical record.  These signatures attest to the fact that that the information above on your After Visit Summary has been reviewed and is understood.  Full responsibility of the confidentiality of this discharge information lies with you and/or your care-partner.  Recommendations Discharge instructions given to patient and/or care partner. Diverticulosis and high fiber diet handouts provided. 

## 2014-05-15 NOTE — Op Note (Signed)
[facility]  [address] [place], [address]   COLONOSCOPY PROCEDURE REPORT  PATIENT: Rebecca Cameron, Rebecca Cameron  MR#: [id number] BIRTHDATE: 08-21-1945 , 16  yrs. old GENDER: female ENDOSCOPIST: Lafayette Dragon, MD REFERRED [id number] Delora Fuel, M.D. PROCEDURE DATE:  05/15/2014 PROCEDURE:   Colonoscopy, screening First Screening Colonoscopy - Avg.  risk and is 50 yrs.  old or older - No.  Prior Negative Screening - Now for repeat screening. 10 or more years since last screening  History of Adenoma - Now for follow-up colonoscopy & has been > or = to 3 yrs.  N/A ASA CLASS:   Class II INDICATIONS:Screening for colonic neoplasia, Colorectal Neoplasm Risk Assessment for this procedure is average risk, and prior colonoscopy in February 2006 was normal. MEDICATIONS: Monitored anesthesia care and Propofol 200 mg IV  DESCRIPTION OF PROCEDURE:   After the risks benefits and alternatives of the procedure were thoroughly explained, informed consent was obtained.  The digital rectal exam revealed no abnormalities of the rectum.   The LB [id number]  endoscope was introduced through the anus and advanced to the cecum, which was identified by both the appendix and ileocecal valve. No adverse events experienced.   The quality of the prep was good.  (MoviPrep was used)  The instrument was then slowly withdrawn as the colon was fully examined.      COLON FINDINGS: There was mild diverticulosis noted in the sigmoid colon.  Retroflexed views revealed no abnormalities. The time to cecum = 7.01 Withdrawal time = 6.06   The scope was withdrawn and the procedure completed. COMPLICATIONS: There were no immediate complications.  ENDOSCOPIC IMPRESSION: There was mild diverticulosis noted in the sigmoid colon  RECOMMENDATIONS: High fiber diet Recall colonoscopy in 10 years  eSigned:  Lafayette Dragon, MD 05/15/2014 9:37 AM   cc:

## 2014-05-16 ENCOUNTER — Telehealth: Payer: Self-pay | Admitting: *Deleted

## 2014-05-16 NOTE — Telephone Encounter (Signed)
  Follow up Call-  Call back number 05/15/2014  Post procedure Call Back phone  # [phone]  Permission to leave phone message Yes     Patient questions:  Do you have a fever, pain , or abdominal swelling? No. Pain Score  0 *  Have you tolerated food without any problems? Yes.    Have you been able to return to your normal activities? Yes.    Do you have any questions about your discharge instructions: Diet   No. Medications  No. Follow up visit  No.  Do you have questions or concerns about your Care? No.  Actions: * If pain score is 4 or above: No action needed, pain <4.

## 2014-05-22 ENCOUNTER — Other Ambulatory Visit (INDEPENDENT_AMBULATORY_CARE_PROVIDER_SITE_OTHER): Payer: Medicare Other

## 2014-05-22 DIAGNOSIS — K219 Gastro-esophageal reflux disease without esophagitis: Secondary | ICD-10-CM | POA: Diagnosis not present

## 2014-05-22 DIAGNOSIS — Z0000 Encounter for general adult medical examination without abnormal findings: Secondary | ICD-10-CM | POA: Diagnosis not present

## 2014-05-22 DIAGNOSIS — Z79899 Other long term (current) drug therapy: Secondary | ICD-10-CM

## 2014-05-22 LAB — CBC WITH DIFFERENTIAL/PLATELET
BASOS PCT: 0.8 % (ref 0.0–3.0)
Basophils Absolute: 0 10*3/uL (ref 0.0–0.1)
Eosinophils Absolute: 0.1 10*3/uL (ref 0.0–0.7)
Eosinophils Relative: 2.5 % (ref 0.0–5.0)
HCT: 39.2 % (ref 36.0–46.0)
Hemoglobin: 13.3 g/dL (ref 12.0–15.0)
LYMPHS PCT: 31.9 % (ref 12.0–46.0)
Lymphs Abs: 1.4 10*3/uL (ref 0.7–4.0)
MCHC: 33.9 g/dL (ref 30.0–36.0)
MCV: 93.8 fl (ref 78.0–100.0)
Monocytes Absolute: 0.5 10*3/uL (ref 0.1–1.0)
Monocytes Relative: 10.3 % (ref 3.0–12.0)
NEUTROS ABS: 2.4 10*3/uL (ref 1.4–7.7)
Neutrophils Relative %: 54.5 % (ref 43.0–77.0)
PLATELETS: 211 10*3/uL (ref 150.0–400.0)
RBC: 4.18 Mil/uL (ref 3.87–5.11)
RDW: 14.1 % (ref 11.5–15.5)
WBC: 4.4 10*3/uL (ref 4.0–10.5)

## 2014-05-22 LAB — LIPID PANEL
CHOL/HDL RATIO: 3
Cholesterol: 210 mg/dL — ABNORMAL HIGH (ref 0–200)
HDL: 67.6 mg/dL (ref >39.00)
LDL Cholesterol: 125 mg/dL — ABNORMAL HIGH (ref 0–99)
NonHDL: 142.4
Triglycerides: 88 mg/dL (ref 0.0–149.0)
VLDL: 17.6 mg/dL (ref 0.0–40.0)

## 2014-05-22 LAB — POCT URINALYSIS DIPSTICK
Bilirubin, UA: NEGATIVE
Glucose, UA: NEGATIVE
KETONES UA: NEGATIVE
Leukocytes, UA: NEGATIVE
Nitrite, UA: NEGATIVE
PROTEIN UA: NEGATIVE
RBC UA: NEGATIVE
SPEC GRAV UA: 1.015
Urobilinogen, UA: 0.2
pH, UA: 7

## 2014-05-22 LAB — TSH: TSH: 2.73 u[IU]/mL (ref 0.35–4.50)

## 2014-05-22 LAB — BASIC METABOLIC PANEL
BUN: 15 mg/dL (ref 6–23)
CHLORIDE: 108 meq/L (ref 96–112)
CO2: 27 mEq/L (ref 19–32)
Calcium: 9.5 mg/dL (ref 8.4–10.5)
Creatinine, Ser: 0.89 mg/dL (ref 0.40–1.20)
GFR: 67.1 mL/min (ref >60.00)
GLUCOSE: 95 mg/dL (ref 70–99)
Potassium: 4.7 mEq/L (ref 3.5–5.1)
Sodium: 141 mEq/L (ref 135–145)

## 2014-05-22 LAB — HEPATIC FUNCTION PANEL
ALT: 15 U/L (ref 0–35)
AST: 17 U/L (ref 0–37)
Albumin: 3.9 g/dL (ref 3.5–5.2)
Alkaline Phosphatase: 53 U/L (ref 39–117)
BILIRUBIN DIRECT: 0.1 mg/dL (ref 0.0–0.3)
Total Bilirubin: 0.7 mg/dL (ref 0.2–1.2)
Total Protein: 6.8 g/dL (ref 6.0–8.3)

## 2014-05-29 ENCOUNTER — Ambulatory Visit (INDEPENDENT_AMBULATORY_CARE_PROVIDER_SITE_OTHER): Payer: Medicare Other | Admitting: Family Medicine

## 2014-05-29 ENCOUNTER — Encounter: Payer: Self-pay | Admitting: Family Medicine

## 2014-05-29 VITALS — BP 140/90 | Temp 98.0°F | Ht 65.5 in | Wt 149.0 lb

## 2014-05-29 DIAGNOSIS — M899 Disorder of bone, unspecified: Secondary | ICD-10-CM

## 2014-05-29 DIAGNOSIS — Z23 Encounter for immunization: Secondary | ICD-10-CM

## 2014-05-29 DIAGNOSIS — G8929 Other chronic pain: Secondary | ICD-10-CM

## 2014-05-29 DIAGNOSIS — G43709 Chronic migraine without aura, not intractable, without status migrainosus: Secondary | ICD-10-CM

## 2014-05-29 DIAGNOSIS — K219 Gastro-esophageal reflux disease without esophagitis: Secondary | ICD-10-CM

## 2014-05-29 DIAGNOSIS — Z0000 Encounter for general adult medical examination without abnormal findings: Secondary | ICD-10-CM

## 2014-05-29 DIAGNOSIS — M549 Dorsalgia, unspecified: Secondary | ICD-10-CM

## 2014-05-29 DIAGNOSIS — N959 Unspecified menopausal and perimenopausal disorder: Secondary | ICD-10-CM

## 2014-05-29 DIAGNOSIS — M949 Disorder of cartilage, unspecified: Secondary | ICD-10-CM

## 2014-05-29 MED ORDER — TRAMADOL HCL 50 MG PO TABS: ORAL_TABLET | ORAL | Status: DC

## 2014-05-29 MED ORDER — ESTRADIOL 0.1 MG/GM VA CREA: TOPICAL_CREAM | VAGINAL | Status: DC

## 2014-05-29 NOTE — Progress Notes (Signed)
   Subjective:    Patient ID: Rebecca Cameron, female    DOB: 1945/03/15, 69 y.o.   MRN: [id number]  HPI Marti is a 69 year old single female nonsmoker,,,,,, retired Engineer, structural,,,,, who comes in today for general physical examination because of a history of reflux esophagitis, osteoporosis, postmenopausal vaginal dryness, migraine headaches  She uses hormonal cream 2-3 times weekly for vaginal dryness  She has a history of osteopenia and takes vitamin D and calcium walks daily. Last bone density was 3 years ago which showed some osteopenia. She had a compression fracture back in November 2015  Her migraines are quiet does not need a refill on her Imitrex  She's not taken any medicine reflux now it's stable.  She gets routine eye care, dental care, BSE monthly, annual mammography, colonoscopy this year and GI normal, vaccinations updated today by Apolonio Schneiders  Cognitive function normal she walks daily and plays golf 3-4 days a week. Home health safety reviewed no issues identified, there are guns in the house she is a former Engineer, structural. She does have a healthcare power of attorney and living well   Review of Systems  Constitutional: Negative.   HENT: Negative.   Eyes: Negative.   Respiratory: Negative.   Cardiovascular: Negative.   Gastrointestinal: Negative.   Endocrine: Negative.   Genitourinary: Negative.   Musculoskeletal: Negative.   Skin: Negative.   Allergic/Immunologic: Negative.   Neurological: Negative.   Hematological: Negative.   Psychiatric/Behavioral: Negative.        Objective:   Physical Exam  Constitutional: She appears well-developed and well-nourished.  HENT:  Head: Normocephalic and atraumatic.  Right Ear: External ear normal.  Left Ear: External ear normal.  Nose: Nose normal.  Mouth/Throat: Oropharynx is clear and moist.  Eyes: EOM are normal. Pupils are equal, round, and reactive to light.  Neck: Normal range of motion. Neck supple. No JVD  present. No tracheal deviation present. No thyromegaly present.  Cardiovascular: Normal rate, regular rhythm, normal heart sounds and intact distal pulses.  Exam reveals no gallop and no friction rub.   No murmur heard. Pulmonary/Chest: Effort normal and breath sounds normal. No stridor. No respiratory distress. She has no wheezes. She has no rales. She exhibits no tenderness.  Abdominal: Soft. Bowel sounds are normal. She exhibits no distension and no mass. There is no tenderness. There is no rebound and no guarding.  Genitourinary:  Pelvic and rectal deferred. She had a colonoscopy a couple months ago which was normal. Pelvic exam and Pap smear year ago were normal. Recommend pelvic and Pap every 3 years  Bilateral breast exam normal  Musculoskeletal: Normal range of motion.  Lymphadenopathy:    She has no cervical adenopathy.  Neurological: She is alert. She has normal reflexes. No cranial nerve deficit. She exhibits normal muscle tone. Coordination normal.  Skin: Skin is warm and dry. No rash noted. No erythema. No pallor.  Total body skin exam normal  Psychiatric: She has a normal mood and affect. Her behavior is normal. Judgment and thought content normal.  Nursing note and vitals reviewed.         Assessment & Plan:  Healthy female  History of osteopenia and compression fracture........ continue calcium vitamin D and exercise  Migraine headaches.......... decreasing over time...Marland KitchenMarland KitchenMarland Kitchen Imitrex when necessary  Postmenopausal vaginal dryness......... continue Premarin cream  History of compression fracture........... tramadol when necessary

## 2014-05-29 NOTE — Progress Notes (Signed)
Pre visit review using our clinic review tool, if applicable. No additional management support is needed unless otherwise documented below in the visit note. 

## 2014-05-29 NOTE — Patient Instructions (Signed)
Continue exercise calcium vitamin D  Follow-up in one year sooner if any problems  Continue the hormonal cream when necessary  Tramadol 50 mg...Marland KitchenMarland KitchenMarland Kitchen 1/2-1 tablet twice daily when necessary for back pain

## 2014-06-18 HISTORY — PX: CATARACT EXTRACTION: SUR2

## 2014-07-31 ENCOUNTER — Telehealth: Payer: Self-pay

## 2014-08-01 NOTE — Telephone Encounter (Signed)
Left message to return call (concerning overdue mammogram)

## 2014-08-24 ENCOUNTER — Telehealth: Payer: Self-pay | Admitting: *Deleted

## 2014-08-24 ENCOUNTER — Encounter: Payer: Self-pay | Admitting: Nurse Practitioner

## 2014-08-24 DIAGNOSIS — G8929 Other chronic pain: Secondary | ICD-10-CM

## 2014-08-24 DIAGNOSIS — R1031 Right lower quadrant pain: Principal | ICD-10-CM

## 2014-08-24 NOTE — Telephone Encounter (Signed)
Patient requests a referral to GI.

## 2014-08-28 ENCOUNTER — Encounter (INDEPENDENT_AMBULATORY_CARE_PROVIDER_SITE_OTHER): Payer: Medicare Other | Admitting: Ophthalmology

## 2014-08-28 DIAGNOSIS — H43813 Vitreous degeneration, bilateral: Secondary | ICD-10-CM | POA: Diagnosis not present

## 2014-08-28 DIAGNOSIS — H59033 Cystoid macular edema following cataract surgery, bilateral: Secondary | ICD-10-CM | POA: Diagnosis not present

## 2014-08-28 DIAGNOSIS — H3531 Nonexudative age-related macular degeneration: Secondary | ICD-10-CM

## 2014-09-08 ENCOUNTER — Encounter: Payer: Self-pay | Admitting: Gastroenterology

## 2014-09-12 ENCOUNTER — Encounter: Payer: Self-pay | Admitting: Nurse Practitioner

## 2014-09-12 ENCOUNTER — Ambulatory Visit (INDEPENDENT_AMBULATORY_CARE_PROVIDER_SITE_OTHER): Payer: Medicare Other | Admitting: Nurse Practitioner

## 2014-09-12 ENCOUNTER — Other Ambulatory Visit (INDEPENDENT_AMBULATORY_CARE_PROVIDER_SITE_OTHER): Payer: Medicare Other

## 2014-09-12 VITALS — BP 126/82 | HR 68 | Ht 64.17 in | Wt 145.1 lb

## 2014-09-12 DIAGNOSIS — R1031 Right lower quadrant pain: Secondary | ICD-10-CM | POA: Insufficient documentation

## 2014-09-12 LAB — BASIC METABOLIC PANEL
BUN: 19 mg/dL (ref 6–23)
CALCIUM: 9.6 mg/dL (ref 8.4–10.5)
CHLORIDE: 104 meq/L (ref 96–112)
CO2: 29 mEq/L (ref 19–32)
CREATININE: 0.98 mg/dL (ref 0.40–1.20)
GFR: 59.99 mL/min — ABNORMAL LOW (ref >60.00)
Glucose, Bld: 90 mg/dL (ref 70–99)
POTASSIUM: 4.2 meq/L (ref 3.5–5.1)
Sodium: 140 mEq/L (ref 135–145)

## 2014-09-12 NOTE — Progress Notes (Signed)
     History of Present Illness:  Patient is a a pleasant 69 year old female known to Dr. Olevia Perches. She had a screening colonoscopy in March of this year with findings of mild diverticulosis. She is here for evaluation of RLQ pain. The pain started just prior to colonoscopy. Initially pain was just an intermittent nagging pain which patient thought may have been secondary to constipation. As time has gone on the episodes have continued to occur despite normal bowel movements. Patient sees no correlation between the pain and eating. Physical activity can exacerbate the pain but it doesn't cause it. In fact, pain often noticeable at night when not physical active. . No unexplained weight loss. No urinary or vaginal symptoms.   Current Medications, Allergies, Past Medical History, Past Surgical History, Family History and Social History were reviewed in [employer] record.  Physical Exam: General: Pleasant, well developed , white female in no acute distress Head: Normocephalic and atraumatic Eyes:  sclerae anicteric, conjunctiva pink  Ears: Normal auditory acuity Lungs: Clear throughout to auscultation Heart: Regular rate and rhythm Abdomen: Soft, non distended, non-tender. No masses, no hepatomegaly. Normal bowel sounds. Negative Carnett's sign. Musculoskeletal: Symmetrical with no gross deformities  Extremities: No edema  Neurological: Alert oriented x 4, grossly nonfocal Psychological:  Alert and cooperative. Normal mood and affect  Assessment and Recommendations:  69 year old female with several month history of intermittent RLQ pain unrelated to meals or bowel movements. Unremarkable screening colonoscopy in March of this year. Musculoskeletal etiology not excluded but pain often occurs independent of activity. Patient becoming concerned about etiology of pain. For further evaluation will obtain CTscan of abd / pelvis with contrast and call her with results.Marland Kitchen

## 2014-09-12 NOTE — Patient Instructions (Signed)
Your physician has requested that you go to the basement for the following lab work before leaving today: BMET (We will contact you with your lab results)  You have been scheduled for a CT scan of the abdomen and pelvis at [facility] ([address]---this is in the same building as Press [facility]).   You are scheduled on 09-14-14 at 3:00pm. You should arrive 15 minutes prior to your appointment time for registration. Please follow the written instructions below on the day of your exam:  WARNING: IF YOU ARE ALLERGIC TO IODINE/X-RAY DYE, PLEASE NOTIFY RADIOLOGY IMMEDIATELY AT [phone]! YOU WILL BE GIVEN A 13 HOUR PREMEDICATION PREP.  1) Do not eat or drink anything after 11:00am (4 hours prior to your test) 2) You have been given 2 bottles of oral contrast to drink. The solution may taste               better if refrigerated, but do NOT add ice or any other liquid to this solution. Shake             well before drinking.    Drink 1 bottle of contrast @ 1:00pm (2 hours prior to your exam)  Drink 1 bottle of contrast @ 2:00pm (1 hour prior to your exam)  You may take any medications as prescribed with a small amount of water except for the following: Metformin, Glucophage, Glucovance, Avandamet, Riomet, Fortamet, Actoplus Met, Janumet, Glumetza or Metaglip. The above medications must be held the day of the exam AND 48 hours after the exam.  The purpose of you drinking the oral contrast is to aid in the visualization of your intestinal tract. The contrast solution may cause some diarrhea. Before your exam is started, you will be given a small amount of fluid to drink. Depending on your individual set of symptoms, you may also receive an intravenous injection of x-ray contrast/dye. Plan on being at [facility] for 30 minutes or long, depending on the type of exam you are having performed.  This test typically takes 30-45 minutes to complete.  If you have any questions  regarding your exam or if you need to reschedule, you may call the CT department at [phone] between the hours of 8:00 am and 5:00 pm, Monday-Friday.  ________________________________________________________________________

## 2014-09-14 ENCOUNTER — Ambulatory Visit (INDEPENDENT_AMBULATORY_CARE_PROVIDER_SITE_OTHER)
Admission: RE | Admit: 2014-09-14 | Discharge: 2014-09-14 | Disposition: A | Discharge status: Home / Self Care | Payer: Medicare Other | Source: Ambulatory Visit | Attending: Nurse Practitioner | Admitting: Nurse Practitioner

## 2014-09-14 DIAGNOSIS — R1031 Right lower quadrant pain: Secondary | ICD-10-CM | POA: Diagnosis not present

## 2014-09-14 IMAGING — CT CT ABD-PELV W/ CM
2 of 4 series · 17 of 46 positions shown, 19 images · IV contrast (Omnipaque 300)
Comparison: None.

CLINICAL DATA: Right lower quadrant pain.

EXAM:
CT ABDOMEN AND PELVIS WITH CONTRAST
TECHNIQUE: Multidetector CT imaging of the abdomen and pelvis was performed
using the standard protocol following bolus administration of
intravenous contrast.
CONTRAST:  100 cc Omnipaque 300

[Series 2: abd/ pel 5mm · axial · 0.70mm/px · z∈[+710,+1110]mm · 14 of 90 slices shown, 16 images]
[im 5/90  soft-tissue]
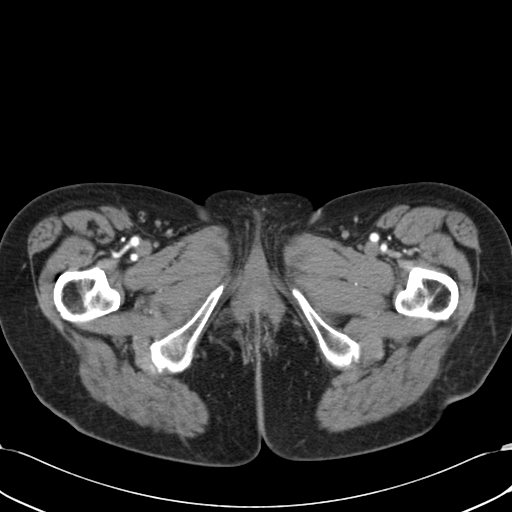
[im 5/90  bone]
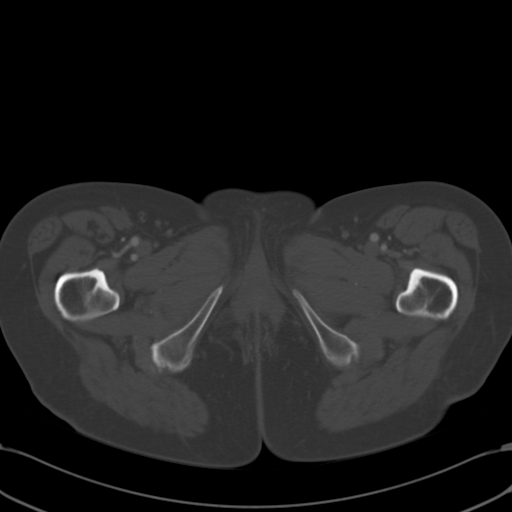
[im 13/90  soft-tissue]
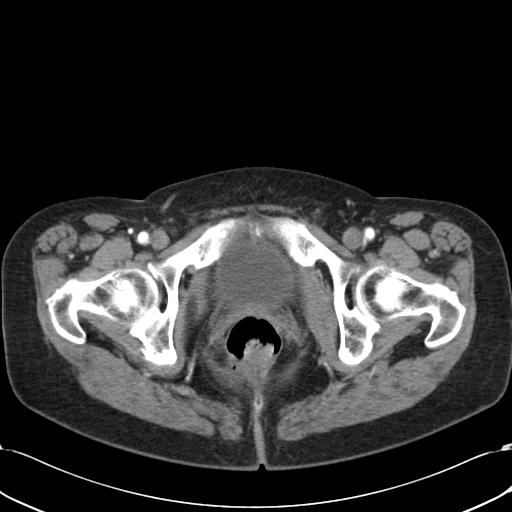
[im 17/90  soft-tissue]
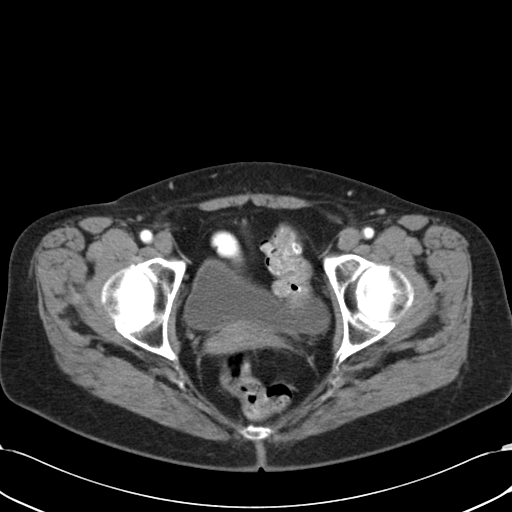
[im 25/90  soft-tissue]
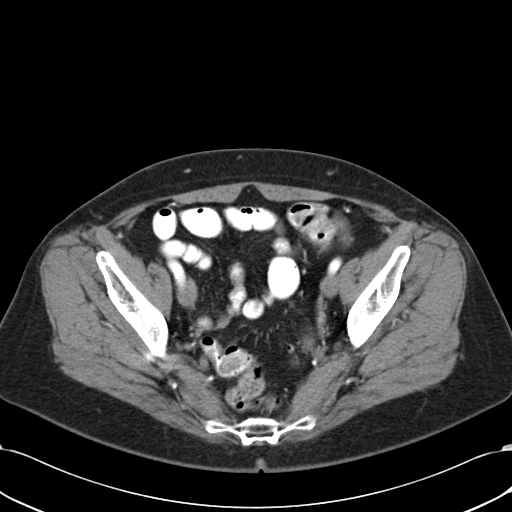
[im 29/90  soft-tissue]
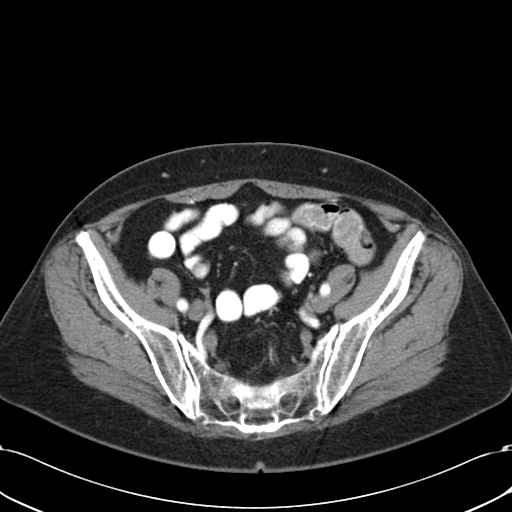
[im 37/90  soft-tissue]
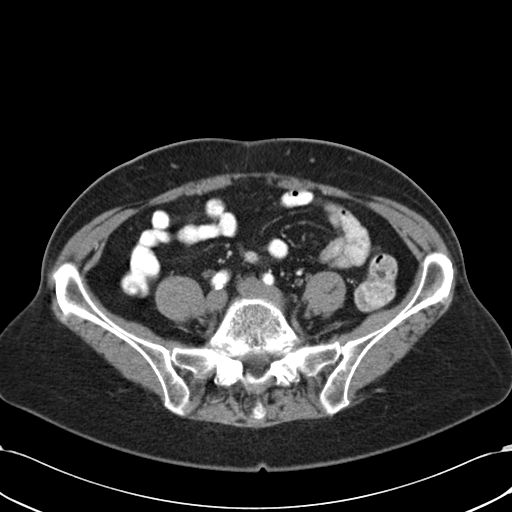
[im 41/90  soft-tissue]
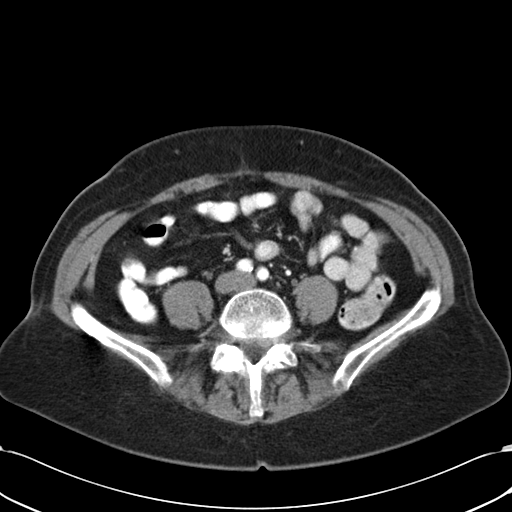
[im 49/90  soft-tissue]
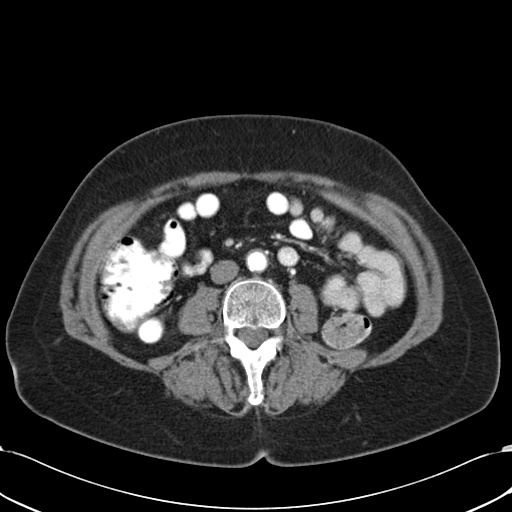
[im 53/90  soft-tissue]
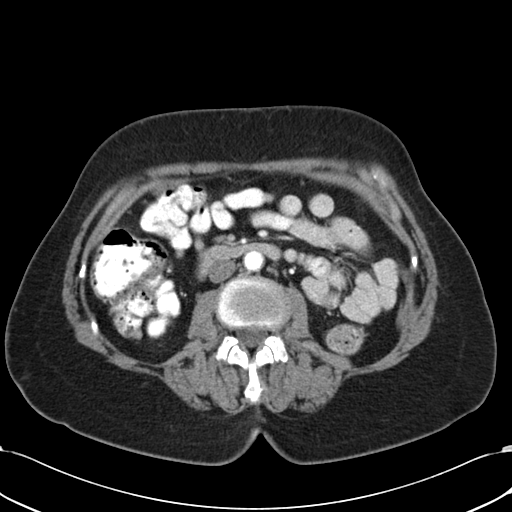
[im 53/90  bone]
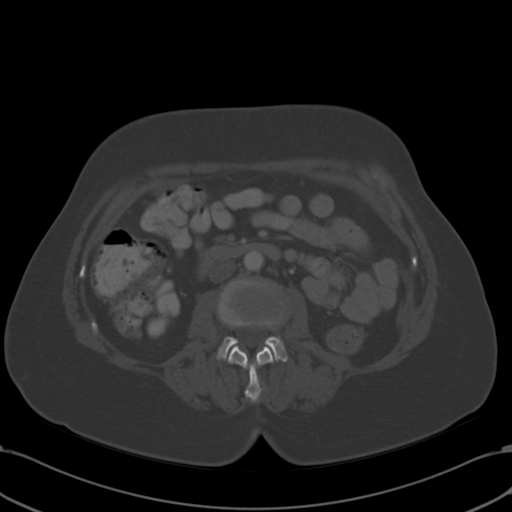
[im 61/90  soft-tissue]
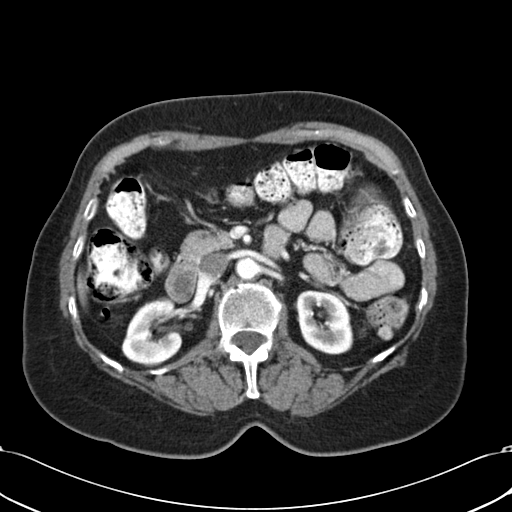
[im 65/90  soft-tissue]
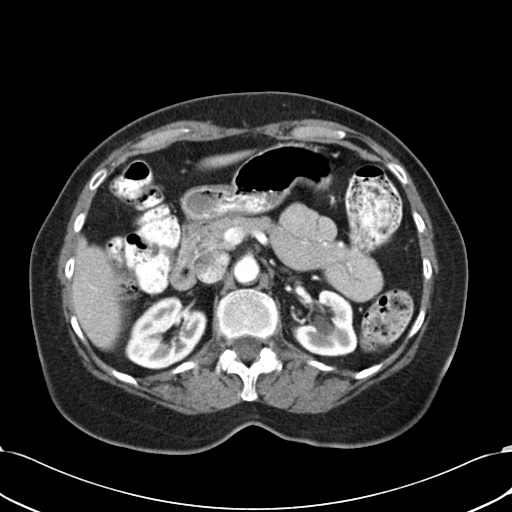
[im 73/90  soft-tissue]
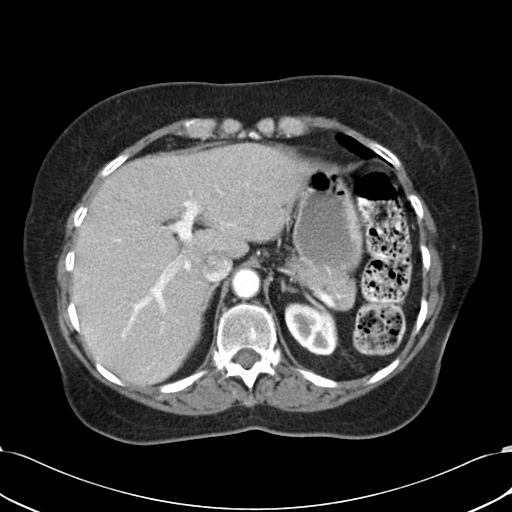
[im 77/90  soft-tissue]
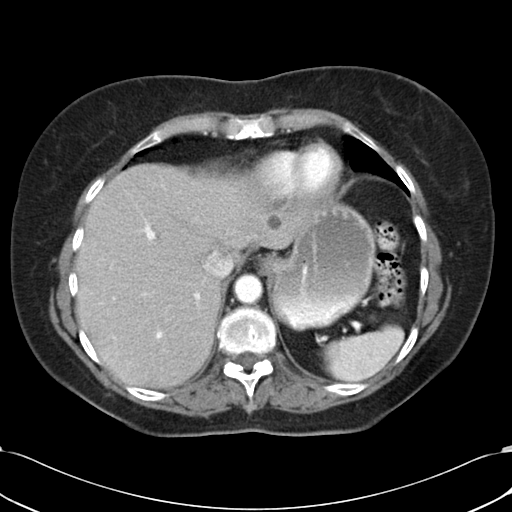
[im 85/90  soft-tissue]
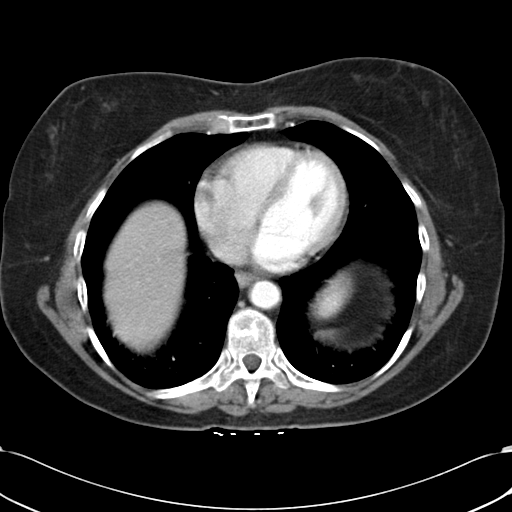

[Series 602: <mpr range> · coronal · 0.90mm/px · 3 of 122 slices shown]
[im 41/122  soft-tissue]
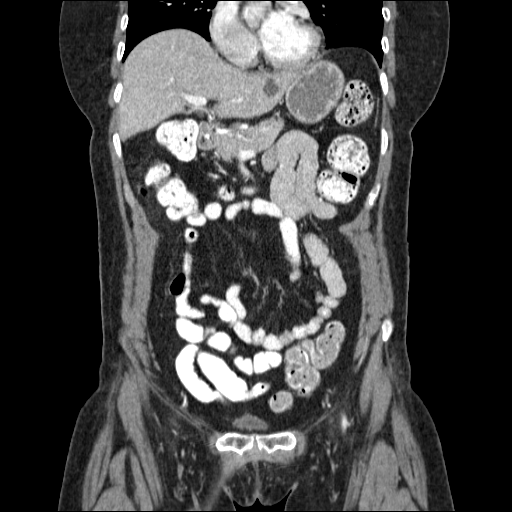
[im 54/122  soft-tissue]
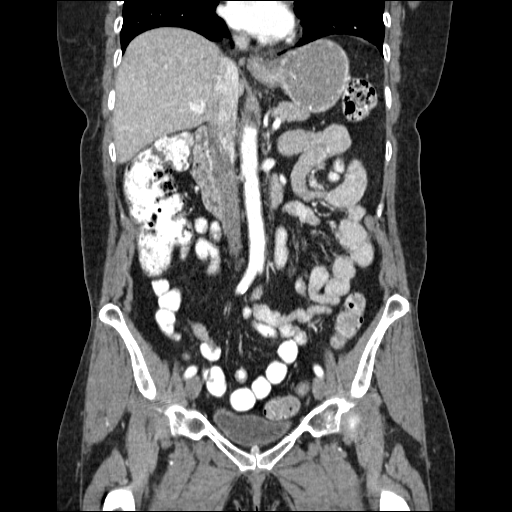
[im 68/122  soft-tissue]
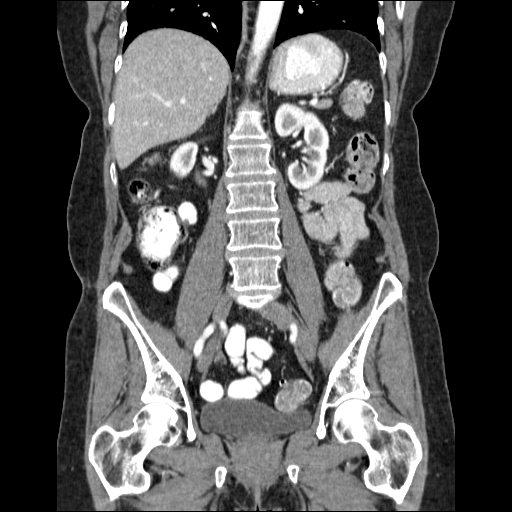

[17 of 46 positions shown; findings below may reference images not displayed]

FINDINGS: Lower chest:  Normal.

Hepatobiliary: 12 mm cyst in the left lobe of the liver. 16 mm
oblong cyst in the lateral segment of the left lobe. Otherwise
normal.

Pancreas: Normal.

Spleen: Normal.

Adrenals/Urinary Tract: Normal.

Stomach/Bowel: There are a few diverticula in the distal colon. The
bowel is otherwise normal including the terminal ileum and appendix.

Vascular/Lymphatic: Aortic atherosclerosis.  No adenopathy.

Reproductive: The uterus is atrophic as are the ovaries.

Other: No free air or free fluid in the abdomen.

Musculoskeletal: No acute osseous abnormality. Degenerative disc
disease at L5-S1.
IMPRESSION: Benign-appearing abdomen and pelvis.

## 2014-09-14 NOTE — Progress Notes (Signed)
Reviewed and agree.

## 2014-10-10 ENCOUNTER — Encounter (INDEPENDENT_AMBULATORY_CARE_PROVIDER_SITE_OTHER): Payer: Medicare Other | Admitting: Ophthalmology

## 2014-10-10 DIAGNOSIS — H3531 Nonexudative age-related macular degeneration: Secondary | ICD-10-CM | POA: Diagnosis not present

## 2014-10-10 DIAGNOSIS — H59032 Cystoid macular edema following cataract surgery, left eye: Secondary | ICD-10-CM

## 2014-10-10 DIAGNOSIS — H43813 Vitreous degeneration, bilateral: Secondary | ICD-10-CM | POA: Diagnosis not present

## 2014-10-10 DIAGNOSIS — H35372 Puckering of macula, left eye: Secondary | ICD-10-CM

## 2014-11-07 ENCOUNTER — Telehealth: Payer: Self-pay | Admitting: Nurse Practitioner

## 2014-11-07 NOTE — Telephone Encounter (Signed)
CT results mailed and the patient is notified

## 2014-11-21 ENCOUNTER — Encounter (INDEPENDENT_AMBULATORY_CARE_PROVIDER_SITE_OTHER): Payer: Medicare Other | Admitting: Ophthalmology

## 2014-11-21 DIAGNOSIS — H353131 Nonexudative age-related macular degeneration, bilateral, early dry stage: Secondary | ICD-10-CM | POA: Diagnosis not present

## 2014-11-21 DIAGNOSIS — H35372 Puckering of macula, left eye: Secondary | ICD-10-CM | POA: Diagnosis not present

## 2014-11-21 DIAGNOSIS — H43813 Vitreous degeneration, bilateral: Secondary | ICD-10-CM | POA: Diagnosis not present

## 2014-11-21 DIAGNOSIS — H59032 Cystoid macular edema following cataract surgery, left eye: Secondary | ICD-10-CM

## 2015-02-28 ENCOUNTER — Encounter (INDEPENDENT_AMBULATORY_CARE_PROVIDER_SITE_OTHER): Payer: Medicare Other | Admitting: Ophthalmology

## 2015-02-28 DIAGNOSIS — H35131 Retinopathy of prematurity, stage 2, right eye: Secondary | ICD-10-CM | POA: Diagnosis not present

## 2015-02-28 DIAGNOSIS — H59032 Cystoid macular edema following cataract surgery, left eye: Secondary | ICD-10-CM | POA: Diagnosis not present

## 2015-02-28 DIAGNOSIS — H43813 Vitreous degeneration, bilateral: Secondary | ICD-10-CM | POA: Diagnosis not present

## 2015-02-28 DIAGNOSIS — H35372 Puckering of macula, left eye: Secondary | ICD-10-CM | POA: Diagnosis not present

## 2015-03-12 LAB — HM MAMMOGRAPHY

## 2015-03-15 ENCOUNTER — Encounter: Payer: Self-pay | Admitting: Family Medicine

## 2015-03-27 ENCOUNTER — Other Ambulatory Visit: Payer: Self-pay | Admitting: Dermatology

## 2015-03-27 DIAGNOSIS — C4492 Squamous cell carcinoma of skin, unspecified: Secondary | ICD-10-CM

## 2015-03-27 HISTORY — DX: Squamous cell carcinoma of skin, unspecified: C44.92

## 2015-07-19 ENCOUNTER — Ambulatory Visit (INDEPENDENT_AMBULATORY_CARE_PROVIDER_SITE_OTHER): Payer: Medicare Other | Admitting: Family Medicine

## 2015-07-19 ENCOUNTER — Encounter: Payer: Self-pay | Admitting: Family Medicine

## 2015-07-19 VITALS — BP 142/85 | HR 70 | Temp 98.1°F | Resp 12 | Ht 64.0 in | Wt 143.7 lb

## 2015-07-19 DIAGNOSIS — N959 Unspecified menopausal and perimenopausal disorder: Secondary | ICD-10-CM

## 2015-07-19 DIAGNOSIS — Z1159 Encounter for screening for other viral diseases: Secondary | ICD-10-CM

## 2015-07-19 DIAGNOSIS — M8580 Other specified disorders of bone density and structure, unspecified site: Secondary | ICD-10-CM

## 2015-07-19 DIAGNOSIS — Z0000 Encounter for general adult medical examination without abnormal findings: Secondary | ICD-10-CM

## 2015-07-19 DIAGNOSIS — E7800 Pure hypercholesterolemia, unspecified: Secondary | ICD-10-CM | POA: Diagnosis not present

## 2015-07-19 DIAGNOSIS — Z131 Encounter for screening for diabetes mellitus: Secondary | ICD-10-CM

## 2015-07-19 DIAGNOSIS — R944 Abnormal results of kidney function studies: Secondary | ICD-10-CM

## 2015-07-19 DIAGNOSIS — L259 Unspecified contact dermatitis, unspecified cause: Secondary | ICD-10-CM | POA: Diagnosis not present

## 2015-07-19 LAB — BASIC METABOLIC PANEL
BUN: 18 mg/dL (ref 6–23)
CALCIUM: 9.5 mg/dL (ref 8.4–10.5)
CO2: 27 mEq/L (ref 19–32)
CREATININE: 0.9 mg/dL (ref 0.40–1.20)
Chloride: 104 mEq/L (ref 96–112)
GFR: 66.02 mL/min (ref >60.00)
GLUCOSE: 88 mg/dL (ref 70–99)
POTASSIUM: 5.1 meq/L (ref 3.5–5.1)
Sodium: 140 mEq/L (ref 135–145)

## 2015-07-19 LAB — MICROALBUMIN / CREATININE URINE RATIO
Creatinine,U: 56.1 mg/dL
Microalb Creat Ratio: 1.2 mg/g (ref 0.0–30.0)

## 2015-07-19 LAB — LIPID PANEL
CHOL/HDL RATIO: 3
CHOLESTEROL: 249 mg/dL — AB (ref 0–200)
HDL: 77.1 mg/dL (ref >39.00)
LDL CALC: 151 mg/dL — AB (ref 0–99)
NonHDL: 171.88
TRIGLYCERIDES: 102 mg/dL (ref 0.0–149.0)
VLDL: 20.4 mg/dL (ref 0.0–40.0)

## 2015-07-19 LAB — VITAMIN D 25 HYDROXY (VIT D DEFICIENCY, FRACTURES): VITD: 43.69 ng/mL (ref 30.00–100.00)

## 2015-07-19 MED ORDER — TRIAMCINOLONE ACETONIDE 0.5 % EX CREA: 1.0000 "application " | TOPICAL_CREAM | Freq: Two times a day (BID) | CUTANEOUS | Status: DC | PRN

## 2015-07-19 NOTE — Patient Instructions (Signed)
A few things to remember from today's visit:   1. Routine physical examination   2. Menopausal and postmenopausal disorder  - DG Bone Density; Future  3. Pure hypercholesterolemia  - Lipid panel  4. Abnormal renal function test  - Microalbumin/Creatinine Ratio, Urine  5. Diabetes mellitus screening  - Basic metabolic panel  6. Osteopenia  - VITAMIN D 25 Hydroxy (Vit-D Deficiency, Fractures)  7. Contact dermatitis  - triamcinolone cream (KENALOG) 0.5 %; Apply 1 application topically 2 (two) times daily as needed.  Dispense: 30 g; Refill: 1   A few tips:  -As we age balance is not as good as it was, so there is a higher risks for falls. Please remove small rugs and furniture that is "in your way" and could increase the risk of falls. Stretching exercises may help with fall prevention: Yoga and Tai Chi are some examples. Low impact exercise is better, so you are not very achy the next day.  -Sun screen and avoidance of direct sun light recommended. Caution with dehydration, if working outdoors be sure to drink enough fluids.  -Healthy diet low in red meet/animal fat and sugar + regular physical activity is recommended.         If you sign-up for My chart, you can communicate easier with [place] in case you have any question or concern.

## 2015-07-19 NOTE — Progress Notes (Signed)
Subjective:    Patient ID: Rebecca Cameron, female    DOB: [birth year], 70 y.o.   MRN: [id number]  HPI   Rebecca Cameron is a 70 y.o. female, who is here today for her routine physical and to establish care with me,former Dr Honor Junes pt.  She does not exercise regularly and does follow a healthy diet, in general she eats everything she wants but with moderation.  She lives with alone, she has friends from church. She has no chronic medical problems.  Pap smear 2 years ago, no Hx of abnormal smear. G:0 Mammogram: 02/2014. According to pt, DEXA 2-3 years ago, osteopenia. No sexually active.  Independent ADL's and IADL's. No falls in the past year and denies depression symptoms.  She sees dermatologists annually and ophthalmologists 2 times per year.She does not always wears hats or sun screen, has has premalignant lesions removed from face.  FHx for gynecologic cancer: ovarian (sister) , colon cancer negative.  Concerns today: months of intermittent , not tender, pruritic rash on umbilicus. Attributed to metal material from pants. She has tried OTC steroid creams, help little.Eventually rash improves but re-occurs. She cleans area with iodine and keeps it dry when rash is present.  BP was elevated today, denies Hx of HTN. Denies severe/frequent headache, visual changes, chest pain, dyspnea, palpitation, claudication, focal weakness, or edema.   Hyperlipidemia: She is on non pharmacologic treatment. Following a low fat diet.   Lab Results  Component Value Date   CHOL 210* 05/22/2014   HDL 67.60 05/22/2014   LDLCALC 125* 05/22/2014   LDLDIRECT 140.7 03/22/2012   TRIG 88.0 05/22/2014   CHOLHDL 3 05/22/2014   Renal function test has been slightly under normal range in the past. She denies gross hematuria, foam in urine, or decreased urine output.    Lab Results  Component Value Date   CREATININE 0.98 09/12/2014   BUN 19 09/12/2014   NA 140 09/12/2014   K 4.2  09/12/2014   CL 104 09/12/2014   CO2 29 09/12/2014     Review of Systems  Constitutional: Negative for fever, appetite change, fatigue and unexpected weight change.  HENT: Negative for hearing loss, mouth sores, sore throat, trouble swallowing and voice change.   Eyes: Negative for photophobia, discharge and visual disturbance.  Respiratory: Negative for cough, shortness of breath and wheezing.   Cardiovascular: Negative for chest pain, palpitations and leg swelling.  Gastrointestinal: Negative for nausea, vomiting and abdominal pain.       No changes in bowel habits.  Endocrine: Negative for cold intolerance and heat intolerance.  Genitourinary: Negative for dysuria, frequency, hematuria, decreased urine volume, vaginal bleeding, vaginal discharge and genital sores.       No nipple discharge, breast masses or tenderness.  Musculoskeletal: Positive for back pain and arthralgias. Negative for joint swelling and neck pain.  Skin: Positive for rash. Negative for wound.  Neurological: Negative for tremors, seizures, syncope, weakness, numbness and headaches.  Hematological: Negative for adenopathy. Does not bruise/bleed easily.  Psychiatric/Behavioral: Negative for confusion and sleep disturbance. The patient is not nervous/anxious.   All other systems reviewed and are negative.    Family History  Problem Relation Age of Onset  . Alzheimer's disease Mother   . Hip fracture Father   . Alcohol abuse Father   . Glaucoma Sister   . Alcohol abuse Brother   . Colon cancer Neg Hx   . Ovarian cancer Sister    Current Outpatient Prescriptions on File  Prior to Visit  Medication Sig Dispense Refill  . calcium citrate-vitamin D (CITRACAL+D) 315-200 MG-UNIT per tablet Take 1 tablet by mouth 2 (two) times daily.    Marland Kitchen estradiol (ESTRACE VAGINAL) 0.1 MG/GM vaginal cream INSERT 4 GM VAGINALY TWICE WEEKLY 125.523 g 6  . FIBER SELECT GUMMIES PO Take by mouth.      . Multiple Vitamins-Minerals  (ONE-A-DAY WOMENS 50 PLUS PO) Take by mouth.      . Probiotic Product (PHILLIPS COLON HEALTH PO) Take 1 capsule by mouth daily.    . traMADol (ULTRAM) 50 MG tablet One half to one tablet twice daily for pain 60 tablet 4   No current facility-administered medications on file prior to visit.     Past Medical History  Diagnosis Date  . PMS (premenstrual syndrome)   . Migraine headache   . Osteopenia   . Compression fracture of spine, non-traumatic ([place])   . Glaucoma   . Arthritis   . Diverticulosis     Social History   Social History  . Marital Status: Single    Spouse Name: N/A  . Number of Children: 0  . Years of Education: N/A   Occupational History  . retired    Social History Main Topics  . Smoking status: Never Smoker   . Smokeless tobacco: Never Used  . Alcohol Use: 2.4 oz/week    2 Standard drinks or equivalent, 2 Cans of beer per week     Comment: OCC.  . Drug Use: No  . Sexual Activity: Not Asked   Other Topics Concern  . None   Social History Narrative    Filed Vitals:   07/19/15 1006  BP: 142/85  Pulse: 70  Temp: 98.1 F (36.7 C)  Resp: 12   Body mass index is 24.65 kg/(m^2).  SpO2 Readings from Last 3 Encounters:  07/19/15 97%  05/15/14 96%  08/16/13 98%   Wt Readings from Last 3 Encounters:  07/19/15 143 lb 11.2 oz (65.182 kg)  09/12/14 145 lb 2 oz (65.828 kg)  05/29/14 149 lb (67.586 kg)       Objective:   Physical Exam  Constitutional: She is oriented to person, place, and time. She appears well-developed and well-nourished. No distress.  HENT:  Right Ear: Hearing, tympanic membrane, external ear and ear canal normal.  Left Ear: Hearing, tympanic membrane, external ear and ear canal normal.  Mouth/Throat: Uvula is midline, oropharynx is clear and moist and mucous membranes are normal.  No major deformities appreciated.  Eyes: Conjunctivae, EOM and lids are normal. Pupils are equal, round, and reactive to light.  Neck: No  muscular tenderness present. No thyroid mass and no thyromegaly present.  Cardiovascular: Normal rate, regular rhythm and normal heart sounds.  Exam reveals no decreased pulses.   No murmur heard. Pulses:      Radial pulses are 2+ on the right side, and 2+ on the left side.       Dorsalis pedis pulses are 2+ on the right side, and 2+ on the left side.       Posterior tibial pulses are 2+ on the right side, and 2+ on the left side.  Pulmonary/Chest: Effort normal and breath sounds normal. No respiratory distress.  Abdominal: Soft. Normal appearance. She exhibits no mass. There is no hepatosplenomegaly. There is no tenderness.  Genitourinary: No breast swelling, tenderness or discharge.   No breast masses bilateral  Musculoskeletal: She exhibits no edema or tenderness.  In general no major deformities or  signs of synovitis.  Lymphadenopathy:       Head (right side): No submandibular adenopathy present.       Head (left side): No submandibular adenopathy present.    She has no cervical adenopathy.    She has no axillary adenopathy.       Right: No supraclavicular adenopathy present.       Left: No supraclavicular adenopathy present.  Neurological: She is alert and oriented to person, place, and time. She has normal strength. No cranial nerve deficit or sensory deficit. Coordination and gait normal.  Reflex Scores:      Bicep reflexes are 2+ on the right side and 2+ on the left side.      Patellar reflexes are 2+ on the right side and 2+ on the left side. Skin: Skin is warm. No erythema.  No suspicious lesions appreciated. Some scattered AK like lesions and light hyperpigmented macular lesions.  Psychiatric: She has a normal mood and affect. Her speech is normal.  Well groomed, good eye contact.  Nursing note and vitals reviewed.       Assessment & Plan:    [place] was seen today for annual exam.  Diagnoses and all orders for this visit:  Routine physical examination   We  discussed the importance of regular physical activity and healthy diet for prevention of chronic illness and/or complications. 2-3 times per week light weightbearing exercises. Preventive guidelines reviewed. Some general recommendations about vaccination, fall prevention, Ca++ and Vit D.       BP re-checked , goal < 150/90, recommended monitoring BP at home periodically. Next CPE in 1 year.   -     Hep C Antibody  Menopausal and postmenopausal disorder  She is using Estrogen vaginal cream occasionally, some side effects discussed.  -     DG Bone Density; Future  Pure hypercholesterolemia  Low fat diet discussed, will follow labs done today and will give further recommendations accordingly. F/U in 6-12 months.  -     Lipid panel  Abnormal renal function test  Further recommendations will be given accordingly.  -     Microalbumin/Creatinine Ratio, Urine  Diabetes mellitus screening -     Basic metabolic panel  Osteopenia  Fall prevention. Ca++ and Vit D to continue. DEXA ordered. Given Hx of vertebral Fx she may benefit from treatment with Fosamax or Reclast.  -     VITAMIN D 25 Hydroxy (Vit-D Deficiency, Fractures)  Contact dermatitis  Ideally try to avoid trigger factor. Topical steroid cream recommended as needed, some side effects discussed. F/U as needed.  -     triamcinolone cream (KENALOG) 0.5 %; Apply 1 application topically 2 (two) times daily as needed.  Need for hepatitis C screening test -     Hep C Antibody        Betty G. Martinique, MD  [facility]. [place] office.

## 2015-07-20 LAB — HEPATITIS C ANTIBODY: HCV AB: NEGATIVE

## 2015-08-06 ENCOUNTER — Telehealth: Payer: Self-pay | Admitting: Family Medicine

## 2015-08-06 NOTE — Telephone Encounter (Signed)
Pt would like to know if the referral for the Bone Density has been placed and where.  Stated that it was suppose to be sometime this month and she has not heard anything.

## 2015-08-06 NOTE — Telephone Encounter (Signed)
Spoke to patient and told her to go ahead and call over to [facility] to make an appt because the order has already been placed.

## 2015-08-08 ENCOUNTER — Ambulatory Visit (INDEPENDENT_AMBULATORY_CARE_PROVIDER_SITE_OTHER)
Admission: RE | Admit: 2015-08-08 | Discharge: 2015-08-08 | Disposition: A | Discharge status: Home / Self Care | Payer: Medicare Other | Source: Ambulatory Visit | Attending: Family Medicine | Admitting: Family Medicine

## 2015-08-08 DIAGNOSIS — N959 Unspecified menopausal and perimenopausal disorder: Secondary | ICD-10-CM

## 2015-08-23 ENCOUNTER — Other Ambulatory Visit: Payer: Self-pay | Admitting: Family Medicine

## 2015-08-23 MED ORDER — ALENDRONATE SODIUM 70 MG PO TABS: 70.0000 mg | ORAL_TABLET | ORAL | Status: DC

## 2015-08-28 ENCOUNTER — Ambulatory Visit (INDEPENDENT_AMBULATORY_CARE_PROVIDER_SITE_OTHER): Payer: Medicare Other | Admitting: Ophthalmology

## 2015-09-13 ENCOUNTER — Ambulatory Visit (INDEPENDENT_AMBULATORY_CARE_PROVIDER_SITE_OTHER): Payer: Medicare Other | Admitting: Ophthalmology

## 2015-09-13 DIAGNOSIS — H59032 Cystoid macular edema following cataract surgery, left eye: Secondary | ICD-10-CM | POA: Diagnosis not present

## 2015-09-13 DIAGNOSIS — H43813 Vitreous degeneration, bilateral: Secondary | ICD-10-CM | POA: Diagnosis not present

## 2015-09-13 DIAGNOSIS — H353131 Nonexudative age-related macular degeneration, bilateral, early dry stage: Secondary | ICD-10-CM | POA: Diagnosis not present

## 2015-09-13 DIAGNOSIS — H35372 Puckering of macula, left eye: Secondary | ICD-10-CM

## 2015-09-18 ENCOUNTER — Telehealth: Payer: Self-pay | Admitting: Family Medicine

## 2015-09-18 NOTE — Telephone Encounter (Signed)
I think she may mean OA. She can try Naproxen OTC (Aleve 220 mg) 2 tabs bid for 5-7 days. Caution because can increase BP and cause other side effects when taken for long time [as with Celebrex,which is Rx and not on her med list]. Also take it with food.  If pain is not better in 2-3 weeks please set up appt.

## 2015-09-18 NOTE — Telephone Encounter (Signed)
Pt is requesting the following med. She said she has osteoporosis and has a flair up and has been given CELEBREX and is asking if Dr Martinique will rx her the med   pharmacy    [facility]

## 2015-09-18 NOTE — Telephone Encounter (Signed)
Spoke with Rebecca Cameron and she says that she has been taking the aleve and it is not helping. Pt is scheduled to come in Thursday at 1pm.

## 2015-09-20 ENCOUNTER — Ambulatory Visit: Payer: Medicare Other | Admitting: Family Medicine

## 2016-03-17 ENCOUNTER — Encounter: Payer: Self-pay | Admitting: Family Medicine

## 2016-03-24 ENCOUNTER — Other Ambulatory Visit: Payer: Self-pay | Admitting: Dermatology

## 2016-05-01 ENCOUNTER — Other Ambulatory Visit: Payer: Self-pay | Admitting: Dermatology

## 2016-06-03 ENCOUNTER — Ambulatory Visit (INDEPENDENT_AMBULATORY_CARE_PROVIDER_SITE_OTHER): Payer: Medicare Other | Admitting: Family Medicine

## 2016-06-03 ENCOUNTER — Encounter: Payer: Self-pay | Admitting: Family Medicine

## 2016-06-03 VITALS — BP 130/70 | HR 73 | Temp 97.7°F | Resp 12 | Ht 64.0 in | Wt 148.6 lb

## 2016-06-03 DIAGNOSIS — M549 Dorsalgia, unspecified: Secondary | ICD-10-CM

## 2016-06-03 DIAGNOSIS — M8580 Other specified disorders of bone density and structure, unspecified site: Secondary | ICD-10-CM

## 2016-06-03 MED ORDER — LIDOCAINE 5 % EX PTCH: 1.0000 | MEDICATED_PATCH | CUTANEOUS | 0 refills | Status: AC

## 2016-06-03 MED ORDER — TIZANIDINE HCL 4 MG PO TABS: 2.0000 mg | ORAL_TABLET | Freq: Three times a day (TID) | ORAL | 0 refills | Status: AC | PRN

## 2016-06-03 NOTE — Patient Instructions (Addendum)
A few things to remember from today's visit:   Upper back pain on right side - Plan: lidocaine (LIDODERM) 5 %, tiZANidine (ZANAFLEX) 4 MG tablet, DG Thoracic Spine W/Swimmers  Osteopenia, unspecified location  Fall prevention   Today X ray was ordered.  This can be done at [facility] between 8 am and 5 pm: [place]. [phone].    Please be sure medication list is accurate. If a new problem present, please set up appointment sooner than planned today.

## 2016-06-03 NOTE — Progress Notes (Signed)
Pre visit review using our clinic review tool, if applicable. No additional management support is needed unless otherwise documented below in the visit note. 

## 2016-06-03 NOTE — Progress Notes (Addendum)
HPI:   [name] is a 71 y.o. female, who is here today c/o back pain, right upper.  ACUTE VISIT: Chief Complaint  Patient presents with  . Back Pain    States that she has had back pain before but usually resolves in a couple days when taking Tramadol but this time it has not. She tells me that she knows this is not muscle pain.  She is concerned about having another vertebral fracture.She tells me that she had imaging done for something else and a fracture was seen, she "did not even know" she had it. She would like to review her last DEXA, states that she never heard about results. She is currently she is on Fosamax, which she tells me that it was started after she had DEXA done.  According to pt, several years ago she took daily injections for osteoporosis and also took Fosamax for a couple years.   Back pain started a day after she was working on her yard, Sunday 06/01/16. She denies any recent injury or unusual level of activity.  Pain is constant, not radiated, "nagging" like, 5/10 in intensity, with no associated numbness or tingling. Exacerbated by prolonged standing,she cannot get comfortable. Alleviated by lying down. Overall pain is stable. No rash or edema on area, fever, chills, or abnormal wt loss.   OTC medications: She also took Aleve today.   Review of Systems  Constitutional: Negative for appetite change, fatigue, fever and unexpected weight change.  HENT: Negative for mouth sores, sore throat and trouble swallowing.   Respiratory: Negative for cough, shortness of breath and wheezing.   Cardiovascular: Negative for chest pain.  Gastrointestinal: Negative for nausea and vomiting.  Genitourinary: Negative for decreased urine volume and hematuria.  Musculoskeletal: Positive for back pain. Negative for joint swelling and neck pain.  Skin: Negative for rash.  Neurological: Negative for syncope, weakness, numbness and headaches.    Psychiatric/Behavioral: Negative for confusion. The patient is nervous/anxious.       Current Outpatient Prescriptions on File Prior to Visit  Medication Sig Dispense Refill  . alendronate (FOSAMAX) 70 MG tablet Take 1 tablet (70 mg total) by mouth every 7 (seven) days. Take with 8oz of water on an empty stomach, do not lay down for 45-60 mins 4 tablet 11  . calcium citrate-vitamin D (CITRACAL+D) 315-200 MG-UNIT per tablet Take 1 tablet by mouth 2 (two) times daily.    Marland Kitchen estradiol (ESTRACE VAGINAL) 0.1 MG/GM vaginal cream INSERT 4 GM VAGINALY TWICE WEEKLY 125.523 g 6  . FIBER SELECT GUMMIES PO Take by mouth.      . Multiple Vitamins-Minerals (ONE-A-DAY WOMENS 50 PLUS PO) Take by mouth.      . Probiotic Product (PHILLIPS COLON HEALTH PO) Take 1 capsule by mouth daily.    . traMADol (ULTRAM) 50 MG tablet One half to one tablet twice daily for pain 60 tablet 4  . triamcinolone cream (KENALOG) 0.5 % Apply 1 application topically 2 (two) times daily as needed. 30 g 1   No current facility-administered medications on file prior to visit.      Past Medical History:  Diagnosis Date  . Arthritis   . Compression fracture of spine, non-traumatic ([place])   . Diverticulosis   . Glaucoma   . Migraine headache   . Osteopenia   . PMS (premenstrual syndrome)    No Known Allergies  Family History  Problem Relation Age of Onset  . Alzheimer's disease Mother   .  Hip fracture Father   . Alcohol abuse Father   . Glaucoma Sister   . Alcohol abuse Brother   . Colon cancer Neg Hx   . Ovarian cancer Sister     Social History   Social History  . Marital status: Single    Spouse name: N/A  . Number of children: 0  . Years of education: N/A   Occupational History  . retired    Social History Main Topics  . Smoking status: Never Smoker  . Smokeless tobacco: Never Used  . Alcohol use 2.4 oz/week    2 Standard drinks or equivalent, 2 Cans of beer per week     Comment: OCC.  . Drug use: No   . Sexual activity: Not Asked   Other Topics Concern  . None   Social History Narrative  . None    Vitals:   06/03/16 1609  BP: 130/70  Pulse: 73  Resp: 12  Temp: 97.7 F (36.5 C)   O2 sat at RA 97% Body mass index is 25.51 kg/m.   Physical Exam  Nursing note and vitals reviewed. Constitutional: She is oriented to person, place, and time. She appears well-developed and well-nourished. She does not appear ill. No distress.  HENT:  Head: Atraumatic.  Mouth/Throat: Oropharynx is clear and moist and mucous membranes are normal.  Eyes: Conjunctivae and EOM are normal.  Neck: No muscular tenderness present.  Respiratory: Effort normal and breath sounds normal. No respiratory distress.  GI: There is no CVA tenderness.  Musculoskeletal: She exhibits no edema.       Thoracic back: She exhibits no bony tenderness and no deformity.  No significant deformity appreciated. Tenderness upon palpation of thoracic paraspinal muscles, right interscapular. Pain elicited is not with movement shoulder. No local edema or erythema appreciated, no suspicious lesions.  Lymphadenopathy:    She has no cervical adenopathy.  Neurological: She is alert and oriented to person, place, and time. She has normal strength. Coordination and gait normal.  Skin: Skin is warm. No rash noted. No erythema.  Psychiatric: Her mood appears anxious.  Fairly groomed, good eye contact.      ASSESSMENT AND PLAN:   Rebecca Cameron was seen today for back pain.  Diagnoses and all orders for this visit:  Upper back pain on right side  Hx suggest muscle strain, no bony tenderness. She is very anxious and would like imaging done, thoracic X ray ordered. I think she will benefit from topical treatment, OTC Lidocaine or icy hot,she would like a Rx for Lidocaine patches. Instructed that if Lidoderm is not covered she can get OTC. Local massage. Caution with Tramadol, which she has from old Rx. Some side effects of  muscle relaxants discussed. F/U as needed.  -     lidocaine (LIDODERM) 5 %; Place 1 patch onto the skin daily. Remove & Discard patch within 12 hours or as directed by MD -     tiZANidine (ZANAFLEX) 4 MG tablet; Take 0.5-1 tablets (2-4 mg total) by mouth every 8 (eight) hours as needed for muscle spasms. -     DG Thoracic Spine W/Swimmers; Future  Osteopenia, unspecified location  We reviewed DEXA done 07/2015, osteopenia with FRAX score for osteoporotic fracture 20% and risk of hip fracture 3% Fall prevention. Continue Fosamax,complete 5 years. Continue Ca++ with D.       Rebecca Cameron G. Martin[place], MD  [facility]. [place] office.

## 2016-06-04 ENCOUNTER — Ambulatory Visit (INDEPENDENT_AMBULATORY_CARE_PROVIDER_SITE_OTHER)
Admission: RE | Admit: 2016-06-04 | Discharge: 2016-06-04 | Disposition: A | Discharge status: Home / Self Care | Payer: Medicare Other | Source: Ambulatory Visit | Attending: Family Medicine | Admitting: Family Medicine

## 2016-06-04 ENCOUNTER — Telehealth: Payer: Self-pay | Admitting: Family Medicine

## 2016-06-04 DIAGNOSIS — M549 Dorsalgia, unspecified: Secondary | ICD-10-CM | POA: Diagnosis not present

## 2016-06-04 NOTE — Telephone Encounter (Signed)
At this time, we haven't received the denial or the reason for the denial.   The x-ray has not been read at this time.

## 2016-06-04 NOTE — Telephone Encounter (Signed)
Pt stating that her Rx LIDODERM needs to have a PA.

## 2016-06-04 NOTE — Telephone Encounter (Signed)
PA has been denied because back pain isn't a supported dx by the FDA.

## 2016-06-04 NOTE — Telephone Encounter (Signed)
I clearly explained Rebecca Cameron that her insurance may not cover it. I initially recommend OTC Lidocaine patches and she insisted in Rx. She can get patches of Lidocaine OTC or can apply [facility] with Lidocaine on area of back pain.  Thanks, BJ

## 2016-06-04 NOTE — Telephone Encounter (Addendum)
Pt following up on request because she just spoke with her insurance company and they told her it was denied.  Advised pt to give the PA submitted a little more time,.   Pt would like you to know she just got home from getting her xray.  Hopes to hear when it gets back.

## 2016-06-04 NOTE — Telephone Encounter (Signed)
PA submitted. Key: [id number]

## 2016-06-05 NOTE — Telephone Encounter (Signed)
Patient is aware of recommendations below.

## 2016-07-21 ENCOUNTER — Encounter: Payer: Medicare Other | Admitting: Family Medicine

## 2018-09-01 ENCOUNTER — Other Ambulatory Visit: Payer: Self-pay | Admitting: *Deleted

## 2018-09-01 DIAGNOSIS — Z20822 Contact with and (suspected) exposure to covid-19: Secondary | ICD-10-CM

## 2018-09-05 LAB — NOVEL CORONAVIRUS, NAA: SARS-CoV-2, NAA: NOT DETECTED

## 2018-12-01 ENCOUNTER — Other Ambulatory Visit: Payer: Self-pay

## 2018-12-01 DIAGNOSIS — Z20822 Contact with and (suspected) exposure to covid-19: Secondary | ICD-10-CM

## 2018-12-02 LAB — NOVEL CORONAVIRUS, NAA: SARS-CoV-2, NAA: NOT DETECTED

## 2019-03-20 ENCOUNTER — Ambulatory Visit: Payer: Self-pay

## 2019-03-27 ENCOUNTER — Ambulatory Visit: Payer: Self-pay

## 2019-03-31 ENCOUNTER — Ambulatory Visit: Payer: Self-pay

## 2019-05-19 ENCOUNTER — Encounter: Payer: Self-pay | Admitting: Family Medicine

## 2019-08-03 ENCOUNTER — Other Ambulatory Visit: Payer: Self-pay

## 2019-08-03 ENCOUNTER — Ambulatory Visit: Payer: Medicare PPO | Admitting: Dermatology

## 2019-08-03 ENCOUNTER — Encounter: Payer: Self-pay | Admitting: Dermatology

## 2019-08-03 DIAGNOSIS — L570 Actinic keratosis: Secondary | ICD-10-CM | POA: Diagnosis not present

## 2019-08-03 DIAGNOSIS — D0439 Carcinoma in situ of skin of other parts of face: Secondary | ICD-10-CM | POA: Diagnosis not present

## 2019-08-03 DIAGNOSIS — D229 Melanocytic nevi, unspecified: Secondary | ICD-10-CM

## 2019-08-03 DIAGNOSIS — D485 Neoplasm of uncertain behavior of skin: Secondary | ICD-10-CM

## 2019-08-03 DIAGNOSIS — Z85828 Personal history of other malignant neoplasm of skin: Secondary | ICD-10-CM | POA: Diagnosis not present

## 2019-08-03 NOTE — Patient Instructions (Signed)

## 2019-08-03 NOTE — Progress Notes (Signed)
   Follow-Up Visit   Subjective  Rebecca Cameron is a 74 y.o. female who presents for the following: Skin Problem (Check spot on cheek brown spot. Scaley spot on patients nose. x couple months. Check spot right leg. Small scaley spot. ).  Growth Location: Left leg Duration: Months Quality:  Associated Signs/Symptoms: Modifying Factors:  Severity:  Timing: Context: History of multiple skin cancers  Objective  Well appearing patient in no apparent distress; mood and affect are within normal limits.  All sun exposed areas plus back examined. Plus legs.   Assessment & Plan   Patient Instructions  Biopsy, Surgery (Curettage) & Surgery (Excision) Aftercare Instructions  1. Okay to remove bandage in 24 hours  2. Wash area with soap and water  3. Apply Vaseline to area twice daily until healed (Not Neosporin)  4. Okay to cover with a Band-Aid to decrease the chance of infection or prevent irritation from clothing; also it's okay to uncover lesion at home.  5. Suture instructions: return to our office in 7-10 or 10-14 days for a nurse visit for suture removal. Variable healing with sutures, if pain or itching occurs call our office. It's okay to shower or bathe 24 hours after sutures are given.  6. The following risks may occur after a biopsy, curettage or excision: bleeding, scarring, discoloration, recurrence, infection (redness, yellow drainage, pain or swelling).  7. For questions, concerns and results call our office at [address] before 4pm & Friday before 3pm. Biopsy results will be available in 1 week.     Neoplasm of uncertain behavior of skin Left Malar Cheek  Skin / nail biopsy Type of biopsy: tangential   Informed consent: discussed and consent obtained   Timeout: patient name, date of birth, surgical site, and procedure verified   Procedure prep:  Patient was prepped and draped in usual sterile fashion Prep type:  Chlorhexidine Anesthesia: the lesion was  anesthetized in a standard fashion   Anesthetic:  1% lidocaine w/ epinephrine 1-100,000 local infiltration Instrument used: flexible razor blade   Hemostasis achieved with: ferric subsulfate   Outcome: patient tolerated procedure well   Post-procedure details: wound care instructions given    Specimen 1 - Surgical pathology Differential Diagnosis: BCC SCC [id number], [id number] Check Margins: No  AK (actinic keratosis) (3) Left Lower Leg - Anterior; Right Supraorbital Region; Right Nasal Sidewall  Destruction of lesion - Left Lower Leg - Anterior, Right Nasal Sidewall, Right Supraorbital Region  Destruction method: cryotherapy   Informed consent: discussed and consent obtained   Timeout:  patient name, date of birth, surgical site, and procedure verified Lesion destroyed using liquid nitrogen: Yes   Region frozen until ice ball extended beyond lesion: Yes   Cryotherapy cycles:  5 Outcome: patient tolerated procedure well with no complications       I, Lavonna Monarch, MD, have reviewed all documentation for this visit.  The documentation on 09/03/19 for the exam, diagnosis, procedures, and orders are all accurate and complete.DR Claude Manges

## 2019-08-10 ENCOUNTER — Telehealth: Payer: Self-pay | Admitting: *Deleted

## 2019-08-10 ENCOUNTER — Encounter: Payer: Self-pay | Admitting: *Deleted

## 2019-08-10 NOTE — Telephone Encounter (Signed)
-----   Message from Lavonna Monarch, MD sent at 08/05/2019  7:05 AM EDT ----- Schedule surgery with Dr. Darene Lamer

## 2019-08-10 NOTE — Telephone Encounter (Signed)
Pathology results to patient. Surgery appointment scheduled.

## 2019-09-03 ENCOUNTER — Encounter: Payer: Self-pay | Admitting: Dermatology

## 2019-09-22 ENCOUNTER — Other Ambulatory Visit: Payer: Self-pay

## 2019-09-22 ENCOUNTER — Encounter: Payer: Self-pay | Admitting: Dermatology

## 2019-09-22 ENCOUNTER — Ambulatory Visit (INDEPENDENT_AMBULATORY_CARE_PROVIDER_SITE_OTHER): Payer: Medicare PPO | Admitting: Dermatology

## 2019-09-22 DIAGNOSIS — Z85828 Personal history of other malignant neoplasm of skin: Secondary | ICD-10-CM

## 2019-09-22 DIAGNOSIS — D099 Carcinoma in situ, unspecified: Secondary | ICD-10-CM

## 2019-09-22 DIAGNOSIS — D0439 Carcinoma in situ of skin of other parts of face: Secondary | ICD-10-CM | POA: Diagnosis not present

## 2019-09-22 NOTE — Patient Instructions (Signed)

## 2019-09-22 NOTE — Progress Notes (Signed)
1.9cm

## 2019-11-05 ENCOUNTER — Encounter: Payer: Self-pay | Admitting: Dermatology

## 2019-11-05 NOTE — Progress Notes (Signed)
   Follow-Up Visit   Subjective  Rebecca Cameron is a 74 y.o. female who presents for the following: Procedure (Left side of the face--).  New growth Location: Left cheek Duration:  Quality:  Associated Signs/Symptoms: Modifying Factors:  Severity:  Timing: Context: In area and previous skin cancer excision Objective  Well appearing patient in no apparent distress; mood and affect are within normal limits.  A focused examination was performed including Head, neck, back.. Relevant physical exam findings are noted in the Assessment and Plan.   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  Destruction of lesion Complexity: simple   Destruction method: electrodesiccation and curettage   Informed consent: discussed and consent obtained   Timeout:  patient name, date of birth, surgical site, and procedure verified Anesthesia: the lesion was anesthetized in a standard fashion   Anesthetic:  1% lidocaine w/ epinephrine 1-100,000 local infiltration Curettage performed in three different directions: Yes   Curettage cycles:  3 Lesion length (cm):  1.2 Lesion width (cm):  1 Margin per side (cm):  0 Final wound size (cm):  1.2 Hemostasis achieved with:  ferric subsulfate Outcome: patient tolerated procedure well with no complications   Post-procedure details: wound care instructions given   Additional details:  Inoculated with parenteral 5% fluorouracil     I, Lavonna Monarch, MD, have reviewed all documentation for this visit.  The documentation on 11/05/19 for the exam, diagnosis, procedures, and orders are all accurate and complete.

## 2019-12-06 DIAGNOSIS — M859 Disorder of bone density and structure, unspecified: Secondary | ICD-10-CM | POA: Diagnosis not present

## 2019-12-06 DIAGNOSIS — F5101 Primary insomnia: Secondary | ICD-10-CM | POA: Diagnosis not present

## 2019-12-06 DIAGNOSIS — Z8781 Personal history of (healed) traumatic fracture: Secondary | ICD-10-CM | POA: Diagnosis not present

## 2019-12-06 DIAGNOSIS — I10 Essential (primary) hypertension: Secondary | ICD-10-CM | POA: Diagnosis not present

## 2019-12-06 DIAGNOSIS — Z5181 Encounter for therapeutic drug level monitoring: Secondary | ICD-10-CM | POA: Diagnosis not present

## 2019-12-06 DIAGNOSIS — R030 Elevated blood-pressure reading, without diagnosis of hypertension: Secondary | ICD-10-CM | POA: Diagnosis not present

## 2019-12-06 DIAGNOSIS — E782 Mixed hyperlipidemia: Secondary | ICD-10-CM | POA: Diagnosis not present

## 2020-01-30 ENCOUNTER — Other Ambulatory Visit: Payer: Self-pay

## 2020-01-30 ENCOUNTER — Ambulatory Visit: Payer: Medicare PPO | Admitting: Dermatology

## 2020-01-30 ENCOUNTER — Encounter: Payer: Self-pay | Admitting: Dermatology

## 2020-01-30 DIAGNOSIS — L570 Actinic keratosis: Secondary | ICD-10-CM

## 2020-01-30 DIAGNOSIS — Z86007 Personal history of in-situ neoplasm of skin: Secondary | ICD-10-CM

## 2020-01-31 ENCOUNTER — Encounter: Payer: Self-pay | Admitting: Dermatology

## 2020-02-01 NOTE — Progress Notes (Signed)
   Follow-Up Visit   Subjective  Rebecca Cameron is a 74 y.o. female who presents for the following: Follow-up (Patient here today for follow up on CIS treated on 09/22/2019.  Patient states she does have a place on the right side of her nose she'd like checked x months no bleeding, per patient the spot is crusty.).  Crusts Location: Face Duration:  Quality:  Associated Signs/Symptoms: Modifying Factors:  Severity:  Timing: Context: History of skin cancers Objective  Well appearing patient in no apparent distress; mood and affect are within normal limits. Objective  Left Malar Cheek: No sign residual, minimal scar, mild dyschromia  Objective  Left Buccal Cheek (2), Right Nasal Sidewall: 2 to 4 mm pink horny crusts   A focused examination was performed including Head and neck.. Relevant physical exam findings are noted in the Assessment and Plan.   Assessment & Plan    History of squamous cell carcinoma in situ (SCCIS) of skin Left Malar Cheek  Return if clinical changes  AK (actinic keratosis) (3) Right Nasal Sidewall; Left Buccal Cheek (2)  Destruction of lesion - Left Buccal Cheek (2), Right Nasal Sidewall Complexity: simple   Destruction method: cryotherapy   Informed consent: discussed and consent obtained   Timeout:  patient name, date of birth, surgical site, and procedure verified Lesion destroyed using liquid nitrogen: Yes   Cryotherapy cycles:  5 Outcome: patient tolerated procedure well with no complications   Post-procedure details: wound care instructions given       I, Lavonna Monarch, MD, have reviewed all documentation for this visit.  The documentation on 02/01/20 for the exam, diagnosis, procedures, and orders are all accurate and complete.

## 2020-04-04 DIAGNOSIS — Z1231 Encounter for screening mammogram for malignant neoplasm of breast: Secondary | ICD-10-CM | POA: Diagnosis not present

## 2020-06-07 DIAGNOSIS — E782 Mixed hyperlipidemia: Secondary | ICD-10-CM | POA: Diagnosis not present

## 2020-06-07 DIAGNOSIS — Z8781 Personal history of (healed) traumatic fracture: Secondary | ICD-10-CM | POA: Diagnosis not present

## 2020-06-07 DIAGNOSIS — R6889 Other general symptoms and signs: Secondary | ICD-10-CM | POA: Diagnosis not present

## 2020-06-07 DIAGNOSIS — M859 Disorder of bone density and structure, unspecified: Secondary | ICD-10-CM | POA: Diagnosis not present

## 2020-06-07 DIAGNOSIS — Z5181 Encounter for therapeutic drug level monitoring: Secondary | ICD-10-CM | POA: Diagnosis not present

## 2020-06-07 DIAGNOSIS — F5101 Primary insomnia: Secondary | ICD-10-CM | POA: Diagnosis not present

## 2020-06-07 DIAGNOSIS — I10 Essential (primary) hypertension: Secondary | ICD-10-CM | POA: Diagnosis not present

## 2020-09-17 DIAGNOSIS — Z961 Presence of intraocular lens: Secondary | ICD-10-CM | POA: Diagnosis not present

## 2020-10-04 DIAGNOSIS — Z8781 Personal history of (healed) traumatic fracture: Secondary | ICD-10-CM | POA: Diagnosis not present

## 2020-10-04 DIAGNOSIS — M859 Disorder of bone density and structure, unspecified: Secondary | ICD-10-CM | POA: Diagnosis not present

## 2020-10-04 DIAGNOSIS — Z5181 Encounter for therapeutic drug level monitoring: Secondary | ICD-10-CM | POA: Diagnosis not present

## 2020-10-04 DIAGNOSIS — Z1389 Encounter for screening for other disorder: Secondary | ICD-10-CM | POA: Diagnosis not present

## 2020-10-04 DIAGNOSIS — Z0000 Encounter for general adult medical examination without abnormal findings: Secondary | ICD-10-CM | POA: Diagnosis not present

## 2020-10-04 DIAGNOSIS — E782 Mixed hyperlipidemia: Secondary | ICD-10-CM | POA: Diagnosis not present

## 2020-10-04 DIAGNOSIS — F5101 Primary insomnia: Secondary | ICD-10-CM | POA: Diagnosis not present

## 2020-10-04 DIAGNOSIS — I10 Essential (primary) hypertension: Secondary | ICD-10-CM | POA: Diagnosis not present

## 2020-10-24 DIAGNOSIS — M85852 Other specified disorders of bone density and structure, left thigh: Secondary | ICD-10-CM | POA: Diagnosis not present

## 2020-10-24 DIAGNOSIS — Z780 Asymptomatic menopausal state: Secondary | ICD-10-CM | POA: Diagnosis not present

## 2020-10-24 DIAGNOSIS — M85851 Other specified disorders of bone density and structure, right thigh: Secondary | ICD-10-CM | POA: Diagnosis not present

## 2020-12-07 DIAGNOSIS — J40 Bronchitis, not specified as acute or chronic: Secondary | ICD-10-CM | POA: Diagnosis not present

## 2020-12-07 DIAGNOSIS — J019 Acute sinusitis, unspecified: Secondary | ICD-10-CM | POA: Diagnosis not present

## 2021-01-29 ENCOUNTER — Ambulatory Visit: Payer: Medicare PPO | Admitting: Dermatology

## 2021-04-05 DIAGNOSIS — Z1231 Encounter for screening mammogram for malignant neoplasm of breast: Secondary | ICD-10-CM | POA: Diagnosis not present

## 2021-04-30 ENCOUNTER — Other Ambulatory Visit: Payer: Self-pay

## 2021-04-30 ENCOUNTER — Ambulatory Visit: Payer: Medicare PPO | Admitting: Dermatology

## 2021-04-30 DIAGNOSIS — D485 Neoplasm of uncertain behavior of skin: Secondary | ICD-10-CM

## 2021-04-30 DIAGNOSIS — C44519 Basal cell carcinoma of skin of other part of trunk: Secondary | ICD-10-CM | POA: Diagnosis not present

## 2021-04-30 DIAGNOSIS — C44629 Squamous cell carcinoma of skin of left upper limb, including shoulder: Secondary | ICD-10-CM

## 2021-04-30 DIAGNOSIS — Z1283 Encounter for screening for malignant neoplasm of skin: Secondary | ICD-10-CM | POA: Diagnosis not present

## 2021-04-30 DIAGNOSIS — L570 Actinic keratosis: Secondary | ICD-10-CM | POA: Diagnosis not present

## 2021-04-30 NOTE — Patient Instructions (Signed)

## 2021-05-11 ENCOUNTER — Encounter: Payer: Self-pay | Admitting: Dermatology

## 2021-05-11 NOTE — Progress Notes (Signed)
? ?Follow-Up Visit ?  ?Subjective  ?Rebecca Cameron is a 76 y.o. female who presents for the following: Annual Exam (Pt has some spots of concern on the L arm, Chest, Dry patches on face ). ? ?Enlarging growth on left arm, multiple crusts. ?Location:  ?Duration:  ?Quality:  ?Associated Signs/Symptoms: ?Modifying Factors:  ?Severity:  ?Timing: ?Context:  ? ?Objective  ?Well appearing patient in no apparent distress; mood and affect are within normal limits. ?Head - Anterior (Face), Left Hand - Posterior, Left Zygomatic Area, Mid Forehead, Right Buccal Cheek, Right Hand - Posterior ?Small pink rough patches compatible with actinic keratoses face and arms. ? ?Left Forearm - Posterior ?Early for 9 mm waxy pink crust ? ? ? ? ?Right Upper Back ?7 mm pearly pink crust ? ? ? ? ? ? ?All sun exposed areas plus back examined.  And chest. ? ? ?Assessment & Plan  ? ? ?AK (actinic keratosis) (6) ?Head - Anterior (Face); Left Hand - Posterior; Right Hand - Posterior; Mid Forehead; Left Zygomatic Area; Right Buccal Cheek ? ?Lesions treated with LN2 freeze.  Pdt in the fall for multiple smaller facial lesions. ? ?Destruction of lesion - Left Hand - Posterior, Right Hand - Posterior ?Complexity: simple   ?Destruction method: cryotherapy   ?Informed consent: discussed and consent obtained   ?Timeout:  patient name, date of birth, surgical site, and procedure verified ?Lesion destroyed using liquid nitrogen: Yes   ?Cryotherapy cycles:  3 ?Outcome: patient tolerated procedure well with no complications   ?Post-procedure details: wound care instructions given   ? ?Squamous cell carcinoma of arm, left ?Left Forearm - Posterior ? ?Skin / nail biopsy ?Type of biopsy: tangential   ?Informed consent: discussed and consent obtained   ?Timeout: patient name, date of birth, surgical site, and procedure verified   ?Anesthesia: the lesion was anesthetized in a standard fashion   ?Anesthetic:  1% lidocaine w/ epinephrine 1-100,000 local  infiltration ?Instrument used: flexible razor blade   ?Hemostasis achieved with: aluminum chloride and electrodesiccation   ?Outcome: patient tolerated procedure well   ?Post-procedure details: wound care instructions given   ? ?Destruction of lesion ?Complexity: simple   ?Destruction method: electrodesiccation and curettage   ?Informed consent: discussed and consent obtained   ?Timeout:  patient name, date of birth, surgical site, and procedure verified ?Anesthesia: the lesion was anesthetized in a standard fashion   ?Anesthetic:  1% lidocaine w/ epinephrine 1-100,000 local infiltration ?Curettage performed in three different directions: Yes   ?Electrodesiccation performed over the curetted area: Yes   ?Curettage cycles:  3 ?Lesion length (cm):  1 ?Lesion width (cm):  1 ?Margin per side (cm):  0 ?Final wound size (cm):  1 ?Hemostasis achieved with:  aluminum chloride ?Outcome: patient tolerated procedure well with no complications   ?Post-procedure details: wound care instructions given   ? ?Specimen 1 - Surgical pathology ?Differential Diagnosis: scc vs bcc-txpbx ? ?Check Margins: No ? ?Basal cell carcinoma, trunk ?Right Upper Back ? ?Skin / nail biopsy ?Type of biopsy: tangential   ?Informed consent: discussed and consent obtained   ?Timeout: patient name, date of birth, surgical site, and procedure verified   ?Anesthesia: the lesion was anesthetized in a standard fashion   ?Anesthetic:  1% lidocaine w/ epinephrine 1-100,000 local infiltration ?Instrument used: flexible razor blade   ?Hemostasis achieved with: ferric subsulfate and electrodesiccation   ?Outcome: patient tolerated procedure well   ?Post-procedure details: wound care instructions given   ? ?Destruction of lesion ?Complexity: simple   ?  Destruction method: electrodesiccation and curettage   ?Informed consent: discussed and consent obtained   ?Timeout:  patient name, date of birth, surgical site, and procedure verified ?Anesthesia: the lesion was  anesthetized in a standard fashion   ?Anesthetic:  1% lidocaine w/ epinephrine 1-100,000 local infiltration ?Curettage performed in three different directions: Yes   ?Electrodesiccation performed over the curetted area: Yes   ?Curettage cycles:  3 ?Lesion length (cm):  0.7 ?Lesion width (cm):  0.7 ?Margin per side (cm):  0 ?Final wound size (cm):  0.7 ?Hemostasis achieved with:  ferric subsulfate ?Outcome: patient tolerated procedure well with no complications   ?Post-procedure details: wound care instructions given   ? ?Specimen 2 - Surgical pathology ?Differential Diagnosis: scc vs bcc -txpbx ? ?Check Margins: No ? ?After shave biopsy, deep keratin noted in the left forearm.  This was saucerized and the base curetted and cauterized.  No deep involvement noted after shave biopsy of the lesion on the right back; base curetted and cauterized. ? ? ? ? ? ?I, Lavonna Monarch, MD, have reviewed all documentation for this visit.  The documentation on 05/11/21 for the exam, diagnosis, procedures, and orders are all accurate and complete. ?

## 2021-06-25 ENCOUNTER — Ambulatory Visit
Admission: RE | Admit: 2021-06-25 | Discharge: 2021-06-25 | Disposition: A | Discharge status: Home / Self Care | Payer: Medicare PPO | Source: Ambulatory Visit | Attending: Family Medicine | Admitting: Family Medicine

## 2021-06-25 ENCOUNTER — Other Ambulatory Visit: Payer: Self-pay | Admitting: Family Medicine

## 2021-06-25 DIAGNOSIS — M533 Sacrococcygeal disorders, not elsewhere classified: Secondary | ICD-10-CM | POA: Diagnosis not present

## 2021-06-25 DIAGNOSIS — I10 Essential (primary) hypertension: Secondary | ICD-10-CM | POA: Diagnosis not present

## 2021-06-25 DIAGNOSIS — F5101 Primary insomnia: Secondary | ICD-10-CM | POA: Diagnosis not present

## 2021-06-25 DIAGNOSIS — Z8781 Personal history of (healed) traumatic fracture: Secondary | ICD-10-CM | POA: Diagnosis not present

## 2021-06-25 DIAGNOSIS — M461 Sacroiliitis, not elsewhere classified: Secondary | ICD-10-CM

## 2021-06-25 DIAGNOSIS — Z5181 Encounter for therapeutic drug level monitoring: Secondary | ICD-10-CM | POA: Diagnosis not present

## 2021-06-25 DIAGNOSIS — Z8639 Personal history of other endocrine, nutritional and metabolic disease: Secondary | ICD-10-CM | POA: Diagnosis not present

## 2021-06-25 DIAGNOSIS — R5383 Other fatigue: Secondary | ICD-10-CM | POA: Diagnosis not present

## 2021-06-25 DIAGNOSIS — E782 Mixed hyperlipidemia: Secondary | ICD-10-CM | POA: Diagnosis not present

## 2021-06-25 DIAGNOSIS — M859 Disorder of bone density and structure, unspecified: Secondary | ICD-10-CM | POA: Diagnosis not present

## 2021-06-25 DIAGNOSIS — R6889 Other general symptoms and signs: Secondary | ICD-10-CM | POA: Diagnosis not present

## 2021-06-25 DIAGNOSIS — R102 Pelvic and perineal pain: Secondary | ICD-10-CM | POA: Diagnosis not present

## 2021-07-05 DIAGNOSIS — M545 Low back pain, unspecified: Secondary | ICD-10-CM | POA: Diagnosis not present

## 2021-07-09 DIAGNOSIS — M4807 Spinal stenosis, lumbosacral region: Secondary | ICD-10-CM | POA: Diagnosis not present

## 2021-07-09 DIAGNOSIS — M5136 Other intervertebral disc degeneration, lumbar region: Secondary | ICD-10-CM | POA: Diagnosis not present

## 2021-07-09 DIAGNOSIS — S32048A Other fracture of fourth lumbar vertebra, initial encounter for closed fracture: Secondary | ICD-10-CM | POA: Diagnosis not present

## 2021-07-09 DIAGNOSIS — M47816 Spondylosis without myelopathy or radiculopathy, lumbar region: Secondary | ICD-10-CM | POA: Diagnosis not present

## 2021-07-12 DIAGNOSIS — M545 Low back pain, unspecified: Secondary | ICD-10-CM | POA: Diagnosis not present

## 2021-07-16 DIAGNOSIS — M545 Low back pain, unspecified: Secondary | ICD-10-CM | POA: Diagnosis not present

## 2021-07-26 DIAGNOSIS — M545 Low back pain, unspecified: Secondary | ICD-10-CM | POA: Diagnosis not present

## 2021-09-13 ENCOUNTER — Ambulatory Visit: Payer: Medicare PPO | Admitting: Internal Medicine

## 2021-09-22 DIAGNOSIS — M8580 Other specified disorders of bone density and structure, unspecified site: Secondary | ICD-10-CM | POA: Insufficient documentation

## 2021-09-22 DIAGNOSIS — I10 Essential (primary) hypertension: Secondary | ICD-10-CM | POA: Insufficient documentation

## 2021-09-22 DIAGNOSIS — E538 Deficiency of other specified B group vitamins: Secondary | ICD-10-CM | POA: Insufficient documentation

## 2021-09-22 NOTE — Progress Notes (Signed)
Subjective:    Patient ID: Rebecca Cameron, female    DOB: 08/15/1945, 76 y.o.   MRN: [id number]     HPI She is here to establish with a new pcp.  Rebecca Cameron is here for follow up of her chronic medical problems, including hypertension, hyperlipidemia, insomnia  Earlier this year - compression fx and OA in lower back.  It is better. No fall - just happened.    Exercise - walking some - not as active as she was.  Does not eat right - single  Before COVID and issues with her back and her dog being sick she was walking more and she has gained weight since then.  She would like to try to lose some of the weight.  Medications and allergies reviewed with patient and updated if appropriate.  Current Outpatient Medications on File Prior to Visit  Medication Sig Dispense Refill   Probiotic Product (PROBIOTIC-10 PO) See admin instructions.     No current facility-administered medications on file prior to visit.     Review of Systems  Constitutional:  Negative for fever.  Respiratory:  Negative for cough, shortness of breath and wheezing.   Cardiovascular:  Negative for chest pain, palpitations and leg swelling.  Gastrointestinal:  Negative for abdominal pain, blood in stool, constipation and diarrhea.       No gerd  Musculoskeletal:  Negative for arthralgias and back pain.  Neurological:  Negative for dizziness, light-headedness and headaches.  Psychiatric/Behavioral:  Negative for dysphoric mood. The patient is not nervous/anxious.        Objective:   Vitals:   09/24/21 1125  BP: 136/80  Pulse: 78  Temp: 98 F (36.7 C)  SpO2: 96%   BP Readings from Last 3 Encounters:  09/24/21 136/80  06/03/16 130/70  07/19/15 (!) 142/85   Wt Readings from Last 3 Encounters:  09/24/21 144 lb 9.6 oz (65.6 kg)  06/03/16 148 lb 9.6 oz (67.4 kg)  07/19/15 143 lb 11.2 oz (65.2 kg)   Body mass index is 24.82 kg/m.    Physical Exam Constitutional:      General: She is not in acute  distress.    Appearance: Normal appearance.  HENT:     Head: Normocephalic and atraumatic.  Eyes:     Conjunctiva/sclera: Conjunctivae normal.  Cardiovascular:     Rate and Rhythm: Normal rate and regular rhythm.     Heart sounds: Normal heart sounds. No murmur heard. Pulmonary:     Effort: Pulmonary effort is normal. No respiratory distress.     Breath sounds: Normal breath sounds. No wheezing.  Musculoskeletal:     Cervical back: Neck supple.     Right lower leg: No edema.     Left lower leg: No edema.  Lymphadenopathy:     Cervical: No cervical adenopathy.  Skin:    General: Skin is warm and dry.     Findings: No rash.  Neurological:     Mental Status: She is alert. Mental status is at baseline.  Psychiatric:        Mood and Affect: Mood normal.        Behavior: Behavior normal.        Lab Results  Component Value Date   WBC 4.4 05/22/2014   HGB 13.3 05/22/2014   HCT 39.2 05/22/2014   PLT 211.0 05/22/2014   GLUCOSE 88 07/19/2015   CHOL 249 (H) 07/19/2015   TRIG 102.0 07/19/2015   HDL 77.10 07/19/2015  LDLDIRECT 140.7 03/22/2012   LDLCALC 151 (H) 07/19/2015   ALT 15 05/22/2014   AST 17 05/22/2014   NA 140 07/19/2015   K 5.1 07/19/2015   CL 104 07/19/2015   CREATININE 0.90 07/19/2015   BUN 18 07/19/2015   CO2 27 07/19/2015   TSH 2.73 05/22/2014   MICROALBUR <0.7 07/19/2015     Assessment & Plan:    See Problem List for Assessment and Plan of chronic medical problems.

## 2021-09-24 ENCOUNTER — Encounter: Payer: Self-pay | Admitting: Internal Medicine

## 2021-09-24 ENCOUNTER — Ambulatory Visit: Payer: Medicare PPO | Admitting: Internal Medicine

## 2021-09-24 VITALS — BP 136/80 | HR 78 | Temp 98.0°F | Ht 64.0 in | Wt 144.6 lb

## 2021-09-24 DIAGNOSIS — K5730 Diverticulosis of large intestine without perforation or abscess without bleeding: Secondary | ICD-10-CM | POA: Insufficient documentation

## 2021-09-24 DIAGNOSIS — E7800 Pure hypercholesterolemia, unspecified: Secondary | ICD-10-CM

## 2021-09-24 DIAGNOSIS — M4850XA Collapsed vertebra, not elsewhere classified, site unspecified, initial encounter for fracture: Secondary | ICD-10-CM | POA: Insufficient documentation

## 2021-09-24 DIAGNOSIS — F5101 Primary insomnia: Secondary | ICD-10-CM

## 2021-09-24 DIAGNOSIS — I10 Essential (primary) hypertension: Secondary | ICD-10-CM | POA: Diagnosis not present

## 2021-09-24 DIAGNOSIS — E538 Deficiency of other specified B group vitamins: Secondary | ICD-10-CM

## 2021-09-24 DIAGNOSIS — G479 Sleep disorder, unspecified: Secondary | ICD-10-CM | POA: Insufficient documentation

## 2021-09-24 DIAGNOSIS — R638 Other symptoms and signs concerning food and fluid intake: Secondary | ICD-10-CM

## 2021-09-24 DIAGNOSIS — L237 Allergic contact dermatitis due to plants, except food: Secondary | ICD-10-CM

## 2021-09-24 MED ORDER — ROSUVASTATIN CALCIUM 20 MG PO TABS: 20.0000 mg | ORAL_TABLET | Freq: Every day | ORAL | 2 refills | Status: DC

## 2021-09-24 MED ORDER — TIRZEPATIDE 2.5 MG/0.5ML ~~LOC~~ SOAJ: 2.5000 mg | SUBCUTANEOUS | 2 refills | Status: DC

## 2021-09-24 MED ORDER — PREDNISONE 10 MG PO TABS
10.0000 mg | ORAL_TABLET | Freq: Two times a day (BID) | ORAL | 2 refills | Status: DC | PRN
Start: 2021-09-24 — End: 2021-12-13

## 2021-09-24 MED ORDER — ZOLPIDEM TARTRATE 5 MG PO TABS
5.0000 mg | ORAL_TABLET | Freq: Every evening | ORAL | 2 refills | Status: DC | PRN
Start: 2021-09-24 — End: 2022-06-20

## 2021-09-24 MED ORDER — LOSARTAN POTASSIUM 25 MG PO TABS: 25.0000 mg | ORAL_TABLET | Freq: Every day | ORAL | 3 refills | Status: DC

## 2021-09-24 NOTE — Patient Instructions (Addendum)
       Medications changes include :   mounjaro 2.5 mg injection weekly   Your prescription(s) have been sent to your pharmacy.      Return for Physical Exam .

## 2021-09-24 NOTE — Assessment & Plan Note (Signed)
Intermittent Prednisone 10 mg twice daily for few days until rash resolves

## 2021-09-24 NOTE — Assessment & Plan Note (Signed)
Chronic Intermittent sleep issues Continue Ambien 5 mg nightly as needed

## 2021-09-24 NOTE — Assessment & Plan Note (Signed)
Chronic Blood pressure well controlled CMP Continue losartan 25 mg daily 

## 2021-09-24 NOTE — Assessment & Plan Note (Signed)
Chronic Taking multivitamin We will check B12 level at physical

## 2021-09-24 NOTE — Assessment & Plan Note (Signed)
Chronic Having difficulty maintaining weight and losing weight after weight gain She would like some help with this short-term only Has requested prescription for Mounjaro-can try, but not sure if this would be covered which she understands Recommended staying on the lowest dose for a brief period of time only Mounjaro 2.5 mg weekly Increase exercise Improve diet, decrease portions

## 2021-09-24 NOTE — Assessment & Plan Note (Signed)
Chronic Regular exercise and healthy diet encouraged Check lipid panel, CMP Continue Crestor 20 mg daily 

## 2021-09-25 ENCOUNTER — Telehealth: Payer: Self-pay

## 2021-09-25 NOTE — Telephone Encounter (Signed)
PA started  Key: [id number]

## 2021-10-14 NOTE — Telephone Encounter (Signed)
Pt is calling to advise Dr. Quay Burow that Darcel Bayley was denied and would like an alternate.  Pt CB [phone] Cell [phone]

## 2021-10-26 NOTE — Telephone Encounter (Signed)
Unfortunately I do not think her insurance will cover any weight loss meds - she can always call them and see.    The alternative would be topamax which is a migraine preventative medication - this decreases appetite.  Possible side effects include confusion or foggy thinking, tingling, drowsiness, dizziness

## 2021-10-29 ENCOUNTER — Encounter: Payer: Self-pay | Admitting: Internal Medicine

## 2021-10-29 NOTE — Patient Instructions (Addendum)
Dermatologists: [facility] or [facility]    Blood work was ordered.      Medications changes include :   none    Return in about 1 year (around 10/31/2022) for Physical Exam.    Health Maintenance, Female Adopting a healthy lifestyle and getting preventive care are important in promoting health and wellness. Ask your health care provider about: The right schedule for you to have regular tests and exams. Things you can do on your own to prevent diseases and keep yourself healthy. What should I know about diet, weight, and exercise? Eat a healthy diet  Eat a diet that includes plenty of vegetables, fruits, low-fat dairy products, and lean protein. Do not eat a lot of foods that are high in solid fats, added sugars, or sodium. Maintain a healthy weight Body mass index (BMI) is used to identify weight problems. It estimates body fat based on height and weight. Your health care provider can help determine your BMI and help you achieve or maintain a healthy weight. Get regular exercise Get regular exercise. This is one of the most important things you can do for your health. Most adults should: Exercise for at least 150 minutes each week. The exercise should increase your heart rate and make you sweat (moderate-intensity exercise). Do strengthening exercises at least twice a week. This is in addition to the moderate-intensity exercise. Spend less time sitting. Even light physical activity can be beneficial. Watch cholesterol and blood lipids Have your blood tested for lipids and cholesterol at 75 years of age, then have this test every 5 years. Have your cholesterol levels checked more often if: Your lipid or cholesterol levels are high. You are older than 75 years of age. You are at high risk for heart disease. What should I know about cancer screening? Depending on your health history and family history, you may need to have cancer screening at various  ages. This may include screening for: Breast cancer. Cervical cancer. Colorectal cancer. Skin cancer. Lung cancer. What should I know about heart disease, diabetes, and high blood pressure? Blood pressure and heart disease High blood pressure causes heart disease and increases the risk of stroke. This is more likely to develop in people who have high blood pressure readings or are overweight. Have your blood pressure checked: Every 3-5 years if you are 75-58 years of age. Every year if you are 6 years old or older. Diabetes Have regular diabetes screenings. This checks your fasting blood sugar level. Have the screening done: Once every three years after age 29 if you are at a normal weight and have a low risk for diabetes. More often and at a younger age if you are overweight or have a high risk for diabetes. What should I know about preventing infection? Hepatitis B If you have a higher risk for hepatitis B, you should be screened for this virus. Talk with your health care provider to find out if you are at risk for hepatitis B infection. Hepatitis C Testing is recommended for: Everyone born from [birth year] through 1964. Anyone with known risk factors for hepatitis C. Sexually transmitted infections (STIs) Get screened for STIs, including gonorrhea and chlamydia, if: You are sexually active and are younger than 75 years of age. You are older than 75 years of age and your health care provider tells you that you are at risk for this type of infection. Your sexual activity has changed since you were last screened, and you are at increased  risk for chlamydia or gonorrhea. Ask your health care provider if you are at risk. Ask your health care provider about whether you are at high risk for HIV. Your health care provider may recommend a prescription medicine to help prevent HIV infection. If you choose to take medicine to prevent HIV, you should first get tested for HIV. You should then be tested  every 3 months for as long as you are taking the medicine. Pregnancy If you are about to stop having your period (premenopausal) and you may become pregnant, seek counseling before you get pregnant. Take 400 to 800 micrograms (mcg) of folic acid every day if you become pregnant. Ask for birth control (contraception) if you want to prevent pregnancy. Osteoporosis and menopause Osteoporosis is a disease in which the bones lose minerals and strength with aging. This can result in bone fractures. If you are 33 years old or older, or if you are at risk for osteoporosis and fractures, ask your health care provider if you should: Be screened for bone loss. Take a calcium or vitamin D supplement to lower your risk of fractures. Be given hormone replacement therapy (HRT) to treat symptoms of menopause. Follow these instructions at home: Alcohol use Do not drink alcohol if: Your health care provider tells you not to drink. You are pregnant, may be pregnant, or are planning to become pregnant. If you drink alcohol: Limit how much you have to: 0-1 drink a day. Know how much alcohol is in your drink. In the [place] one drink equals one 12 oz bottle of beer (355 mL), one 5 oz glass of wine (148 mL), or one 1 oz glass of hard liquor (44 mL). Lifestyle Do not use any products that contain nicotine or tobacco. These products include cigarettes, chewing tobacco, and vaping devices, such as e-cigarettes. If you need help quitting, ask your health care provider. Do not use street drugs. Do not share needles. Ask your health care provider for help if you need support or information about quitting drugs. General instructions Schedule regular health, dental, and eye exams. Stay current with your vaccines. Tell your health care provider if: You often feel depressed. You have ever been abused or do not feel safe at home. Summary Adopting a healthy lifestyle and getting preventive care are important in promoting  health and wellness. Follow your health care provider's instructions about healthy diet, exercising, and getting tested or screened for diseases. Follow your health care provider's instructions on monitoring your cholesterol and blood pressure. This information is not intended to replace advice given to you by your health care provider. Make sure you discuss any questions you have with your health care provider. Document Revised: 06/25/2020 Document Reviewed: 06/25/2020 Elsevier Patient Education  [place].

## 2021-10-29 NOTE — Progress Notes (Unsigned)
Subjective:    Patient ID: NOHELI MELDER, female    DOB: 12-05-1945, 76 y.o.   MRN: [id number]      HPI Haylie is here for a Physical exam.   Concern over weight - mounjaro was not covered.  Discussed topamax.     Medications and allergies reviewed with patient and updated if appropriate.  Current Outpatient Medications on File Prior to Visit  Medication Sig Dispense Refill   losartan (COZAAR) 25 MG tablet Take 1 tablet (25 mg total) by mouth daily. 90 tablet 3   predniSONE (DELTASONE) 10 MG tablet Take 1 tablet (10 mg total) by mouth 2 (two) times daily as needed (take with food). 20 tablet 2   Probiotic Product (PROBIOTIC-10 PO) See admin instructions.     rosuvastatin (CRESTOR) 20 MG tablet Take 1 tablet (20 mg total) by mouth daily. 90 tablet 2   zolpidem (AMBIEN) 5 MG tablet Take 1 tablet (5 mg total) by mouth at bedtime as needed for sleep. 30 tablet 2   No current facility-administered medications on file prior to visit.    Review of Systems     Objective:  There were no vitals filed for this visit. There were no vitals filed for this visit. There is no height or weight on file to calculate BMI.  BP Readings from Last 3 Encounters:  09/24/21 136/80  06/03/16 130/70  07/19/15 (!) 142/85    Wt Readings from Last 3 Encounters:  09/24/21 144 lb 9.6 oz (65.6 kg)  06/03/16 148 lb 9.6 oz (67.4 kg)  07/19/15 143 lb 11.2 oz (65.2 kg)       Physical Exam Constitutional: She appears well-developed and well-nourished. No distress.  HENT:  Head: Normocephalic and atraumatic.  Right Ear: External ear normal. Normal ear canal and TM Left Ear: External ear normal.  Normal ear canal and TM Mouth/Throat: Oropharynx is clear and moist.  Eyes: Conjunctivae normal.  Neck: Neck supple. No tracheal deviation present. No thyromegaly present.  No carotid bruit  Cardiovascular: Normal rate, regular rhythm and normal heart sounds.   No murmur heard.  No  edema. Pulmonary/Chest: Effort normal and breath sounds normal. No respiratory distress. She has no wheezes. She has no rales.  Breast: deferred   Abdominal: Soft. She exhibits no distension. There is no tenderness.  Lymphadenopathy: She has no cervical adenopathy.  Skin: Skin is warm and dry. She is not diaphoretic.  Psychiatric: She has a normal mood and affect. Her behavior is normal.     Lab Results  Component Value Date   WBC 4.4 05/22/2014   HGB 13.3 05/22/2014   HCT 39.2 05/22/2014   PLT 211.0 05/22/2014   GLUCOSE 88 07/19/2015   CHOL 249 (H) 07/19/2015   TRIG 102.0 07/19/2015   HDL 77.10 07/19/2015   LDLDIRECT 140.7 03/22/2012   LDLCALC 151 (H) 07/19/2015   ALT 15 05/22/2014   AST 17 05/22/2014   NA 140 07/19/2015   K 5.1 07/19/2015   CL 104 07/19/2015   CREATININE 0.90 07/19/2015   BUN 18 07/19/2015   CO2 27 07/19/2015   TSH 2.73 05/22/2014   MICROALBUR <0.7 07/19/2015         Assessment & Plan:   Physical exam: Screening blood work  ordered Exercise   Weight   Substance abuse  none   Reviewed recommended immunizations.   Health Maintenance  Topic Date Due   DEXA SCAN  08/08/2018   COVID-19 Vaccine (6 - Moderna risk series)  01/07/2021   INFLUENZA VACCINE  09/17/2021   COLONOSCOPY (Pts 45-71yr Insurance coverage will need to be confirmed)  05/14/2024   TETANUS/TDAP  09/17/2026   Pneumonia Vaccine 75 Years old  Completed   Hepatitis C Screening  Completed   HPV VACCINES  Aged Out   Zoster Vaccines- Shingrix  Discontinued          See Problem List for Assessment and Plan of chronic medical problems.

## 2021-10-30 ENCOUNTER — Ambulatory Visit (INDEPENDENT_AMBULATORY_CARE_PROVIDER_SITE_OTHER): Payer: Medicare PPO | Admitting: Internal Medicine

## 2021-10-30 ENCOUNTER — Encounter: Payer: Medicare PPO | Admitting: Internal Medicine

## 2021-10-30 VITALS — BP 120/78 | HR 65 | Temp 98.0°F | Ht 64.0 in | Wt 144.0 lb

## 2021-10-30 DIAGNOSIS — S22009D Unspecified fracture of unspecified thoracic vertebra, subsequent encounter for fracture with routine healing: Secondary | ICD-10-CM | POA: Diagnosis not present

## 2021-10-30 DIAGNOSIS — I10 Essential (primary) hypertension: Secondary | ICD-10-CM

## 2021-10-30 DIAGNOSIS — Z0000 Encounter for general adult medical examination without abnormal findings: Secondary | ICD-10-CM | POA: Diagnosis not present

## 2021-10-30 DIAGNOSIS — E7800 Pure hypercholesterolemia, unspecified: Secondary | ICD-10-CM | POA: Diagnosis not present

## 2021-10-30 DIAGNOSIS — F5101 Primary insomnia: Secondary | ICD-10-CM | POA: Diagnosis not present

## 2021-10-30 DIAGNOSIS — R739 Hyperglycemia, unspecified: Secondary | ICD-10-CM | POA: Diagnosis not present

## 2021-10-30 DIAGNOSIS — M8589 Other specified disorders of bone density and structure, multiple sites: Secondary | ICD-10-CM | POA: Diagnosis not present

## 2021-10-30 DIAGNOSIS — R638 Other symptoms and signs concerning food and fluid intake: Secondary | ICD-10-CM

## 2021-10-30 DIAGNOSIS — E538 Deficiency of other specified B group vitamins: Secondary | ICD-10-CM | POA: Diagnosis not present

## 2021-10-30 LAB — LIPID PANEL
Cholesterol: 155 mg/dL (ref 0–200)
HDL: 77.2 mg/dL (ref >39.00)
LDL Cholesterol: 65 mg/dL (ref 0–99)
NonHDL: 77.94
Total CHOL/HDL Ratio: 2
Triglycerides: 66 mg/dL (ref 0.0–149.0)
VLDL: 13.2 mg/dL (ref 0.0–40.0)

## 2021-10-30 LAB — COMPREHENSIVE METABOLIC PANEL
ALT: 15 U/L (ref 0–35)
AST: 19 U/L (ref 0–37)
Albumin: 4.1 g/dL (ref 3.5–5.2)
Alkaline Phosphatase: 42 U/L (ref 39–117)
BUN: 20 mg/dL (ref 6–23)
CO2: 25 mEq/L (ref 19–32)
Calcium: 9.3 mg/dL (ref 8.4–10.5)
Chloride: 106 mEq/L (ref 96–112)
Creatinine, Ser: 1.03 mg/dL (ref 0.40–1.20)
GFR: 53.34 mL/min — ABNORMAL LOW (ref >60.00)
Glucose, Bld: 86 mg/dL (ref 70–99)
Potassium: 4.3 mEq/L (ref 3.5–5.1)
Sodium: 140 mEq/L (ref 135–145)
Total Bilirubin: 0.4 mg/dL (ref 0.2–1.2)
Total Protein: 7.2 g/dL (ref 6.0–8.3)

## 2021-10-30 LAB — CBC WITH DIFFERENTIAL/PLATELET
Basophils Absolute: 0 10*3/uL (ref 0.0–0.1)
Basophils Relative: 0.8 % (ref 0.0–3.0)
Eosinophils Absolute: 0.1 10*3/uL (ref 0.0–0.7)
Eosinophils Relative: 2.8 % (ref 0.0–5.0)
HCT: 34.6 % — ABNORMAL LOW (ref 36.0–46.0)
Hemoglobin: 11.5 g/dL — ABNORMAL LOW (ref 12.0–15.0)
Lymphocytes Relative: 26.4 % (ref 12.0–46.0)
Lymphs Abs: 1.2 10*3/uL (ref 0.7–4.0)
MCHC: 33.2 g/dL (ref 30.0–36.0)
MCV: 97.5 fl (ref 78.0–100.0)
Monocytes Absolute: 0.6 10*3/uL (ref 0.1–1.0)
Monocytes Relative: 12 % (ref 3.0–12.0)
Neutro Abs: 2.7 10*3/uL (ref 1.4–7.7)
Neutrophils Relative %: 58 % (ref 43.0–77.0)
Platelets: 198 10*3/uL (ref 150.0–400.0)
RBC: 3.54 Mil/uL — ABNORMAL LOW (ref 3.87–5.11)
RDW: 13.3 % (ref 11.5–15.5)
WBC: 4.7 10*3/uL (ref 4.0–10.5)

## 2021-10-30 LAB — TSH: TSH: 2.07 u[IU]/mL (ref 0.35–5.50)

## 2021-10-30 LAB — VITAMIN D 25 HYDROXY (VIT D DEFICIENCY, FRACTURES): VITD: 46.21 ng/mL (ref 30.00–100.00)

## 2021-10-30 LAB — VITAMIN B12: Vitamin B-12: 377 pg/mL (ref 211–911)

## 2021-10-30 LAB — HEMOGLOBIN A1C: Hgb A1c MFr Bld: 5.9 % (ref 4.6–6.5)

## 2021-10-30 MED ORDER — MULTIVITAMINS PO CAPS: 1.0000 | ORAL_CAPSULE | Freq: Every day | ORAL | Status: AC

## 2021-10-30 NOTE — Assessment & Plan Note (Signed)
Chronic ?Check B12 level ?

## 2021-10-30 NOTE — Telephone Encounter (Signed)
Spoke with patient today during clinic.

## 2021-10-30 NOTE — Assessment & Plan Note (Signed)
Chronic Check lipid panel  Continue crestor 20 mg daily Regular exercise and healthy diet encouraged  

## 2021-10-30 NOTE — Assessment & Plan Note (Addendum)
Chronic dexa up to date - had one a couple of years ago H/o compression fx Check vitamin d level Advised calcium and vitamin d daily Continue regular exercise

## 2021-10-30 NOTE — Assessment & Plan Note (Addendum)
Gained weight during covid - hard time losing it Her weight is actually pretty good - I think she will feel better and will likely be able to lose a few pounds once she can get back to regular exercise and work on her diet a little

## 2021-10-30 NOTE — Assessment & Plan Note (Addendum)
H/o compression fx dexa up to date Check vitamin d level

## 2021-10-30 NOTE — Assessment & Plan Note (Signed)
Chronic BP well controlled Continue losartan 25 mg daily cmp  

## 2021-10-30 NOTE — Assessment & Plan Note (Signed)
Chronic Intermittent Uses ambien 5 mg nightly prn only - continue

## 2021-11-12 ENCOUNTER — Encounter: Payer: Self-pay | Admitting: Internal Medicine

## 2021-11-12 DIAGNOSIS — D649 Anemia, unspecified: Secondary | ICD-10-CM

## 2021-11-14 ENCOUNTER — Ambulatory Visit (INDEPENDENT_AMBULATORY_CARE_PROVIDER_SITE_OTHER): Payer: Medicare PPO

## 2021-11-14 VITALS — Ht 64.0 in | Wt 144.0 lb

## 2021-11-14 DIAGNOSIS — Z0000 Encounter for general adult medical examination without abnormal findings: Secondary | ICD-10-CM

## 2021-11-14 NOTE — Addendum Note (Signed)
Addended by: Binnie Rail on: 11/14/2021 08:53 PM   Modules accepted: Orders

## 2021-11-14 NOTE — Progress Notes (Signed)
Virtual Visit via Telephone Note  I connected with  JULIE NAY on 11/14/21 at  3:45 PM EDT by telephone and verified that I am speaking with the correct person using two identifiers.  Location: Patient: Home Provider: [place] Persons participating in the virtual visit: Roy   I discussed the limitations, risks, security and privacy concerns of performing an evaluation and management service by telephone and the availability of in person appointments. The patient expressed understanding and agreed to proceed.  Interactive audio and video telecommunications were attempted between this nurse and patient, however failed, due to patient having technical difficulties OR patient did not have access to video capability.  We continued and completed visit with audio only.  Some vital signs may be absent or patient reported.   Sheral Flow, LPN  Subjective:   ALZORA HA is a 76 y.o. female who presents for Medicare Annual (Subsequent) preventive examination.  Review of Systems     Cardiac Risk Factors include: advanced age (>99mn, >>64women);dyslipidemia;hypertension     Objective:    Today's Vitals   11/14/21 1602  Weight: 144 lb (65.3 kg)  Height: '5\' 4"'$  (1.626 m)  PainSc: 0-No pain   Body mass index is 24.72 kg/m.     11/14/2021    3:51 PM 05/01/2014    9:58 AM  Advanced Directives  Does Patient Have a Medical Advance Directive? Yes Yes  Type of [place]of [place]  Copy of [place] Chart? No - copy requested     Current Medications (verified) Outpatient Encounter Medications as of 11/14/2021  Medication Sig   losartan (COZAAR) 25 MG tablet Take 1 tablet (25 mg total) by mouth daily.   Multiple Vitamin (MULTIVITAMIN) capsule Take 1 capsule by mouth daily.   predniSONE (DELTASONE) 10 MG tablet Take 1 tablet (10 mg total) by  mouth 2 (two) times daily as needed (take with food).   Probiotic Product (PROBIOTIC-10 PO) See admin instructions.   rosuvastatin (CRESTOR) 20 MG tablet Take 1 tablet (20 mg total) by mouth daily.   zolpidem (AMBIEN) 5 MG tablet Take 1 tablet (5 mg total) by mouth at bedtime as needed for sleep.   No facility-administered encounter medications on file as of 11/14/2021.    Allergies (verified) Patient has no known allergies.   History: Past Medical History:  Diagnosis Date   Arthritis    Basal cell carcinoma 07/08/2005   right post shoulder tx curet x3 553f  BCC (basal cell carcinoma of skin) 10/13/2007   left upper arm tx curet x3 [id number] Compression fracture of spine, non-traumatic (HCC)    Diverticulosis    Glaucoma    Migraine headache    Osteopenia    PMS (premenstrual syndrome)    SCC (squamous cell carcinoma) 03/27/2015   lateeral under left eye tx imiquimod   SCC (squamous cell carcinoma) 03/27/2015   medial under eye tx imiquimod   SCC (squamous cell carcinoma) 03/24/2016   left cheek TX cx3 [id number], EXC   Squamous cell carcinoma of skin 03/27/2015   left angle of jaw tx imiquimod   Squamous cell carcinoma of skin 08/03/2019   in situ-left malar cheek ([id number])   Past Surgical History:  Procedure Laterality Date   CATARACT EXTRACTION  06-2014   COLONOSCOPY  2006   LUMBDrexelGERY  2003   Family History  Problem Relation Age of Onset  Alzheimer's disease Mother    Hip fracture Father    Alcohol abuse Father    Glaucoma Sister    Alcohol abuse Brother    Ovarian cancer Sister    Colon cancer Neg Hx    Social History   Socioeconomic History   Marital status: Single    Spouse name: Not on file   Number of children: 0   Years of education: Not on file   Highest education level: Not on file  Occupational History   Occupation: retired  Tobacco Use   Smoking status: Never   Smokeless tobacco: Never  Substance and Sexual Activity   Alcohol use:  Yes    Alcohol/week: 4.0 standard drinks of alcohol    Types: 2 Standard drinks or equivalent, 2 Cans of beer per week    Comment: OCC.   Drug use: No   Sexual activity: Not on file  Other Topics Concern   Not on file  Social History Narrative   Not on file   Social Determinants of Health   Financial Resource Strain: Low Risk  (11/14/2021)   Overall Financial Resource Strain (CARDIA)    Difficulty of Paying Living Expenses: Not hard at all  Food Insecurity: No Food Insecurity (11/14/2021)   Hunger Vital Sign    Worried About Running Out of Food in the Last Year: Never true    Ran Out of Food in the Last Year: Never true  Transportation Needs: No Transportation Needs (11/14/2021)   PRAPARE - Hydrologist (Medical): No    Lack of Transportation (Non-Medical): No  Physical Activity: Sufficiently Active (11/14/2021)   Exercise Vital Sign    Days of Exercise per Week: 5 days    Minutes of Exercise per Session: 30 min  Stress: No Stress Concern Present (11/14/2021)   [place]    Feeling of Stress : Not at all  Social Connections: Moderately Integrated (11/14/2021)   Social Connection and Isolation Panel [NHANES]    Frequency of Communication with Friends and Family: More than three times a week    Frequency of Social Gatherings with Friends and Family: More than three times a week    Attends Religious Services: More than 4 times per year    Active Member of [employer] or Organizations: Yes    Attends Music therapist: More than 4 times per year    Marital Status: Never married    Tobacco Counseling Counseling given: Not Answered   Clinical Intake:  Pre-visit preparation completed: Yes  Pain : No/denies pain Pain Score: 0-No pain     Nutritional Risks: None Diabetes: No  How often do you need to have someone help you when you read instructions, pamphlets, or other  written materials from your doctor or pharmacy?: 1 - Never What is the last grade level you completed in school?: HSG; Bachelor's degree  Diabetic? no  Interpreter Needed?: No  Information entered by :: Lisette Abu, LPN.   Activities of Daily Living    11/14/2021    3:55 PM  In your present state of health, do you have any difficulty performing the following activities:  Hearing? 0  Vision? 0  Difficulty concentrating or making decisions? 0  Walking or climbing stairs? 0  Dressing or bathing? 0  Doing errands, shopping? 0  Preparing Food and eating ? N  Using the Toilet? N  In the past six months, have you accidently leaked urine?  N  Do you have problems with loss of bowel control? N  Managing your Medications? N  Managing your Finances? N  Housekeeping or managing your Housekeeping? N    Patient Care Team: Binnie Rail, MD as PCP - General (Internal Medicine) Lavonna Monarch, MD (Inactive) as Consulting Physician (Dermatology) Drake Leach, [place] as Consulting Physician (Optometry)  Indicate any recent Medical Services you may have received from other than [facility] providers in the past year (date may be approximate).     Assessment:   This is a routine wellness examination for [place].  Hearing/Vision screen Hearing Screening - Comments:: Denies hearing difficulties   Vision Screening - Comments:: Wears rx glasses - up to date with routine eye exams with Beatrix Fetters, OD.   Dietary issues and exercise activities discussed: Current Exercise Habits: Home exercise routine;Structured exercise class, Type of exercise: walking;treadmill;stretching;strength training/weights;exercise ball;calisthenics, Time (Minutes): 30, Frequency (Times/Week): 5, Weekly Exercise (Minutes/Week): 150, Intensity: Moderate, Exercise limited by: orthopedic condition(s);respiratory conditions(s)   Goals Addressed             This Visit's Progress    Patient Stated       "My goal is to  stay healthy, physically and socially active.      Depression Screen    11/14/2021    4:05 PM 10/30/2021    7:57 AM 07/19/2015   10:07 AM 05/29/2014    9:27 AM 05/05/2013   11:55 AM  PHQ 2/9 Scores  PHQ - 2 Score 0 0 0 0 0    Fall Risk    11/14/2021    3:51 PM 10/30/2021    2:28 PM 10/30/2021    7:56 AM 07/19/2015   10:07 AM 05/29/2014    9:27 AM  Fall Risk   Falls in the past year? 0 0 0 No No  Number falls in past yr: 0 0 0    Injury with Fall? 0 0 0    Risk for fall due to : No Fall Risks No Fall Risks No Fall Risks    Follow up Falls prevention discussed Falls evaluation completed Falls evaluation completed      FALL RISK PREVENTION PERTAINING TO THE HOME:  Any stairs in or around the home? No  If so, are there any without handrails? No  Home free of loose throw rugs in walkways, pet beds, electrical cords, etc? Yes  Adequate lighting in your home to reduce risk of falls? Yes   ASSISTIVE DEVICES UTILIZED TO PREVENT FALLS:  Life alert? No  Use of a cane, walker or w/c? No  Grab bars in the bathroom? No  Shower chair or bench in shower? No  Elevated toilet seat or a handicapped toilet? No   TIMED UP AND GO:  Was the test performed? No . Phone Visit  Cognitive Function:        11/14/2021    4:01 PM  6CIT Screen  What Year? 0 points  What month? 0 points  What time? 0 points  Count back from 20 0 points  Months in reverse 0 points  Repeat phrase 0 points  Total Score 0 points    Immunizations Immunization History  Administered Date(s) Administered   Influenza Split 12/16/2012, 11/22/2018, 11/11/2019   Influenza Whole 02/18/2004, 11/18/2006, 11/20/2009   Influenza-Unspecified 11/13/2021   Moderna Covid-19 Vaccine Bivalent Booster 13yr & up 11/12/2020   Moderna Sars-Covid-2 Vaccination 03/22/2019, 04/21/2019, 12/21/2019, 05/30/2020   Pneumococcal Conjugate-13 05/05/2013, 09/16/2016   Pneumococcal Polysaccharide-23 05/29/2014, 09/18/2017  Td 02/18/2004,  05/29/2014   Tdap 09/16/2016   Zoster, Live 04/05/2008, 12/18/2016, 03/17/2017    TDAP status: Up to date  Flu Vaccine status: Up to date  Pneumococcal vaccine status: Up to date  Covid-19 vaccine status: Completed vaccines  Qualifies for Shingles Vaccine? Yes   Zostavax completed Yes   Shingrix Completed?: Yes  Screening Tests Health Maintenance  Topic Date Due   COVID-19 Vaccine (6 - Moderna risk series) 11/15/2021 (Originally 01/07/2021)   DEXA SCAN  10/31/2022 (Originally 08/08/2018)   COLONOSCOPY (Pts 45-88yr Insurance coverage will need to be confirmed)  05/14/2024   TETANUS/TDAP  09/17/2026   Pneumonia Vaccine 75 Years old  Completed   INFLUENZA VACCINE  Completed   Hepatitis C Screening  Completed   HPV VACCINES  Aged Out   Zoster Vaccines- Shingrix  Discontinued    Health Maintenance  There are no preventive care reminders to display for this patient.  Colorectal cancer screening: Type of screening: Colonoscopy. Completed 05/15/2014. Repeat every 10 years   Mammogram status: Completed 04/05/2021. Repeat every year  Bone Density status: Completed 10/24/2020. Results reflect: Bone density results: OSTEOPENIA. Repeat every 2-3 years.  Lung Cancer Screening: (Low Dose CT Chest recommended if Age 514-80years, 30 pack-year currently smoking OR have quit w/in 15years.) does not qualify.   Lung Cancer Screening Referral: no  Additional Screening:  Hepatitis C Screening: does qualify; Completed 07/19/2015  Vision Screening: Recommended annual ophthalmology exams for early detection of glaucoma and other disorders of the eye. Is the patient up to date with their annual eye exam?  Yes  Who is the provider or what is the name of the office in which the patient attends annual eye exams? [facility]. If pt is not established with a provider, would they like to be referred to a provider to establish care? No .   Dental Screening: Recommended annual dental exams for  proper oral hygiene  Community Resource Referral / Chronic Care Management: CRR required this visit?  No   CCM required this visit?  No      Plan:     I have personally reviewed and noted the following in the patient's chart:   Medical and social history Use of alcohol, tobacco or illicit drugs  Current medications and supplements including opioid prescriptions. Patient is not currently taking opioid prescriptions. Functional ability and status Nutritional status Physical activity Advanced directives List of other physicians Hospitalizations, surgeries, and ER visits in previous 12 months Vitals Screenings to include cognitive, depression, and falls Referrals and appointments  In addition, I have reviewed and discussed with patient certain preventive protocols, quality metrics, and best practice recommendations. A written personalized care plan for preventive services as well as general preventive health recommendations were provided to patient.     SSheral Flow LPN   [id number]  Nurse Notes: N/A

## 2021-11-14 NOTE — Patient Instructions (Signed)
Rebecca Cameron , Thank you for taking time to come for your Medicare Wellness Visit. I appreciate your ongoing commitment to your health goals. Please review the following plan we discussed and let me know if I can assist you in the future.   These are the goals we discussed:  Goals      Patient Stated     "My goal is to stay healthy, physically and socially active.        This is a list of the screening recommended for you and due dates:  Health Maintenance  Topic Date Due   COVID-19 Vaccine (6 - Moderna risk series) 11/15/2021*   DEXA scan (bone density measurement)  10/31/2022*   Colon Cancer Screening  05/14/2024   Tetanus Vaccine  09/17/2026   Pneumonia Vaccine  Completed   Flu Shot  Completed   Hepatitis C Screening: USPSTF Recommendation to screen - Ages 18-79 yo.  Completed   HPV Vaccine  Aged Out   Zoster (Shingles) Vaccine  Discontinued  *Topic was postponed. The date shown is not the original due date.    Advanced directives: Yes; Please bring a copy of your health care power of attorney and living will to the office at your convenience.  Conditions/risks identified: Yes  Next appointment: Follow up in one year for your annual wellness visit by calling [phone] to schedule with Nurse Mignon Pine.   Preventive Care 44 Years and Older, Female Preventive care refers to lifestyle choices and visits with your health care provider that can promote health and wellness. What does preventive care include? A yearly physical exam. This is also called an annual well check. Dental exams once or twice a year. Routine eye exams. Ask your health care provider how often you should have your eyes checked. Personal lifestyle choices, including: Daily care of your teeth and gums. Regular physical activity. Eating a healthy diet. Avoiding tobacco and drug use. Limiting alcohol use. Practicing safe sex. Taking low-dose aspirin every day. Taking vitamin and mineral supplements as  recommended by your health care provider. What happens during an annual well check? The services and screenings done by your health care provider during your annual well check will depend on your age, overall health, lifestyle risk factors, and family history of disease. Counseling  Your health care provider may ask you questions about your: Alcohol use. Tobacco use. Drug use. Emotional well-being. Home and relationship well-being. Sexual activity. Eating habits. History of falls. Memory and ability to understand (cognition). Work and work Statistician. Reproductive health. Screening  You may have the following tests or measurements: Height, weight, and BMI. Blood pressure. Lipid and cholesterol levels. These may be checked every 5 years, or more frequently if you are over 68 years old. Skin check. Lung cancer screening. You may have this screening every year starting at age 76 if you have a 30-pack-year history of smoking and currently smoke or have quit within the past 15 years. Fecal occult blood test (FOBT) of the stool. You may have this test every year starting at age 41. Flexible sigmoidoscopy or colonoscopy. You may have a sigmoidoscopy every 5 years or a colonoscopy every 10 years starting at age 64. Hepatitis C blood test. Hepatitis B blood test. Sexually transmitted disease (STD) testing. Diabetes screening. This is done by checking your blood sugar (glucose) after you have not eaten for a while (fasting). You may have this done every 1-3 years. Bone density scan. This is done to screen for osteoporosis. You may have  this done starting at age 11. Mammogram. This may be done every 1-2 years. Talk to your health care provider about how often you should have regular mammograms. Talk with your health care provider about your test results, treatment options, and if necessary, the need for more tests. Vaccines  Your health care provider may recommend certain vaccines, such  as: Influenza vaccine. This is recommended every year. Tetanus, diphtheria, and acellular pertussis (Tdap, Td) vaccine. You may need a Td booster every 10 years. Zoster vaccine. You may need this after age 30. Pneumococcal 13-valent conjugate (PCV13) vaccine. One dose is recommended after age 85. Pneumococcal polysaccharide (PPSV23) vaccine. One dose is recommended after age 51. Talk to your health care provider about which screenings and vaccines you need and how often you need them. This information is not intended to replace advice given to you by your health care provider. Make sure you discuss any questions you have with your health care provider. Document Released: 03/02/2015 Document Revised: 10/24/2015 Document Reviewed: 12/05/2014 Elsevier Interactive Patient Education  2017 [employer] Prevention in the Home Falls can cause injuries. They can happen to people of all ages. There are many things you can do to make your home safe and to help prevent falls. What can I do on the outside of my home? Regularly fix the edges of walkways and driveways and fix any cracks. Remove anything that might make you trip as you walk through a door, such as a raised step or threshold. Trim any bushes or trees on the path to your home. Use bright outdoor lighting. Clear any walking paths of anything that might make someone trip, such as rocks or tools. Regularly check to see if handrails are loose or broken. Make sure that both sides of any steps have handrails. Any raised decks and porches should have guardrails on the edges. Have any leaves, snow, or ice cleared regularly. Use sand or salt on walking paths during winter. Clean up any spills in your garage right away. This includes oil or grease spills. What can I do in the bathroom? Use night lights. Install grab bars by the toilet and in the tub and shower. Do not use towel bars as grab bars. Use non-skid mats or decals in the tub or  shower. If you need to sit down in the shower, use a plastic, non-slip stool. Keep the floor dry. Clean up any water that spills on the floor as soon as it happens. Remove soap buildup in the tub or shower regularly. Attach bath mats securely with double-sided non-slip rug tape. Do not have throw rugs and other things on the floor that can make you trip. What can I do in the bedroom? Use night lights. Make sure that you have a light by your bed that is easy to reach. Do not use any sheets or blankets that are too big for your bed. They should not hang down onto the floor. Have a firm chair that has side arms. You can use this for support while you get dressed. Do not have throw rugs and other things on the floor that can make you trip. What can I do in the kitchen? Clean up any spills right away. Avoid walking on wet floors. Keep items that you use a lot in easy-to-reach places. If you need to reach something above you, use a strong step stool that has a grab bar. Keep electrical cords out of the way. Do not use floor polish or  wax that makes floors slippery. If you must use wax, use non-skid floor wax. Do not have throw rugs and other things on the floor that can make you trip. What can I do with my stairs? Do not leave any items on the stairs. Make sure that there are handrails on both sides of the stairs and use them. Fix handrails that are broken or loose. Make sure that handrails are as long as the stairways. Check any carpeting to make sure that it is firmly attached to the stairs. Fix any carpet that is loose or worn. Avoid having throw rugs at the top or bottom of the stairs. If you do have throw rugs, attach them to the floor with carpet tape. Make sure that you have a light switch at the top of the stairs and the bottom of the stairs. If you do not have them, ask someone to add them for you. What else can I do to help prevent falls? Wear shoes that: Do not have high heels. Have  rubber bottoms. Are comfortable and fit you well. Are closed at the toe. Do not wear sandals. If you use a stepladder: Make sure that it is fully opened. Do not climb a closed stepladder. Make sure that both sides of the stepladder are locked into place. Ask someone to hold it for you, if possible. Clearly mark and make sure that you can see: Any grab bars or handrails. First and last steps. Where the edge of each step is. Use tools that help you move around (mobility aids) if they are needed. These include: Canes. Walkers. Scooters. Crutches. Turn on the lights when you go into a dark area. Replace any light bulbs as soon as they burn out. Set up your furniture so you have a clear path. Avoid moving your furniture around. If any of your floors are uneven, fix them. If there are any pets around you, be aware of where they are. Review your medicines with your doctor. Some medicines can make you feel dizzy. This can increase your chance of falling. Ask your doctor what other things that you can do to help prevent falls. This information is not intended to replace advice given to you by your health care provider. Make sure you discuss any questions you have with your health care provider. Document Released: 11/30/2008 Document Revised: 07/12/2015 Document Reviewed: 03/10/2014 Elsevier Interactive Patient Education  2017 [employer].

## 2021-11-18 ENCOUNTER — Other Ambulatory Visit (INDEPENDENT_AMBULATORY_CARE_PROVIDER_SITE_OTHER): Payer: Medicare PPO

## 2021-11-18 DIAGNOSIS — D649 Anemia, unspecified: Secondary | ICD-10-CM | POA: Diagnosis not present

## 2021-11-18 LAB — CBC WITH DIFFERENTIAL/PLATELET
Basophils Absolute: 0.1 10*3/uL (ref 0.0–0.1)
Basophils Relative: 1.4 % (ref 0.0–3.0)
Eosinophils Absolute: 0.1 10*3/uL (ref 0.0–0.7)
Eosinophils Relative: 2.1 % (ref 0.0–5.0)
HCT: 33.2 % — ABNORMAL LOW (ref 36.0–46.0)
Hemoglobin: 11.4 g/dL — ABNORMAL LOW (ref 12.0–15.0)
Lymphocytes Relative: 24.6 % (ref 12.0–46.0)
Lymphs Abs: 1.1 10*3/uL (ref 0.7–4.0)
MCHC: 34.3 g/dL (ref 30.0–36.0)
MCV: 96.5 fl (ref 78.0–100.0)
Monocytes Absolute: 0.5 10*3/uL (ref 0.1–1.0)
Monocytes Relative: 11.1 % (ref 3.0–12.0)
Neutro Abs: 2.8 10*3/uL (ref 1.4–7.7)
Neutrophils Relative %: 60.8 % (ref 43.0–77.0)
Platelets: 193 10*3/uL (ref 150.0–400.0)
RBC: 3.45 Mil/uL — ABNORMAL LOW (ref 3.87–5.11)
RDW: 13.3 % (ref 11.5–15.5)
WBC: 4.5 10*3/uL (ref 4.0–10.5)

## 2021-11-18 LAB — IBC PANEL
Iron: 71 ug/dL (ref 42–145)
Saturation Ratios: 23.1 % (ref 20.0–50.0)
TIBC: 308 ug/dL (ref 250.0–450.0)
Transferrin: 220 mg/dL (ref 212.0–360.0)

## 2021-11-18 LAB — FOLATE: Folate: >23.9 ng/mL (ref >5.9)

## 2021-11-18 LAB — FERRITIN: Ferritin: 16.4 ng/mL (ref 10.0–291.0)

## 2021-12-11 ENCOUNTER — Other Ambulatory Visit: Payer: Self-pay | Admitting: Internal Medicine

## 2021-12-13 ENCOUNTER — Other Ambulatory Visit: Payer: Self-pay | Admitting: Internal Medicine

## 2021-12-23 ENCOUNTER — Ambulatory Visit: Payer: Medicare PPO | Admitting: Dermatology

## 2022-02-05 ENCOUNTER — Encounter: Payer: Self-pay | Admitting: Internal Medicine

## 2022-02-05 NOTE — Progress Notes (Unsigned)
Subjective:  Rebecca Cameron is a 76 y.o. female who presents for a 1 wk hx of headache, ear fullness, sinus pain, post nasal drainage, and now having cough and congestion.   No fever, chills, body aches, chest pain, palpitations, shortness of breath,  +exposure to Covid one week ago. Negative test at home.      No other aggravating or relieving factors.  No other c/o.  ROS as in subjective.   Objective: Vitals:   02/06/22 1435  BP: (!) 142/80  Pulse: 70  Temp: 97.6 F (36.4 C)  SpO2: 99%    General appearance: Alert, WD/WN, no distress, mildly ill appearing                             Skin: warm, no rash                           Head: no sinus tenderness                            Eyes: conjunctiva normal, corneas clear, PERRLA                            Ears: pearly TMs, external ear canals normal                          Nose: septum midline, turbinates swollen, with erythema              Mouth/throat: MMM, tongue normal, mild pharyngeal erythema                           Neck: supple, no adenopathy, no thyromegaly, nontender                          Heart: RRR                         Lungs: CTA bilaterally, no wheezes, rales, or rhonchi      Assessment: Acute non-recurrent pansinusitis - Plan: amoxicillin-clavulanate (AUGMENTIN) 875-125 MG tablet  Nasal congestion with rhinorrhea - Plan: POCT Influenza A/B, POC COVID-19  Post-nasal drainage - Plan: POCT Influenza A/B, POC COVID-19  Lower respiratory infection  Acute cough - Plan: POC COVID-19, HYDROcodone bit-homatropine (HYCODAN) 5-1.5 MG/5ML syrup   Plan: Negative COVID and flu test. Augmentin prescribed.  Hycodan prescribed per patient request.  She is aware that this medication is sedating and to avoid other sedatives.  Discussed symptomatic management Tylenol or Ibuprofen OTC for fever and malaise.   Follow-up if worsening or not back to baseline when she completes the antibiotic.

## 2022-02-06 ENCOUNTER — Ambulatory Visit: Payer: Medicare PPO | Admitting: Family Medicine

## 2022-02-06 ENCOUNTER — Encounter: Payer: Self-pay | Admitting: Family Medicine

## 2022-02-06 VITALS — BP 142/80 | HR 70 | Temp 97.6°F | Ht 64.0 in | Wt 143.0 lb

## 2022-02-06 DIAGNOSIS — J0140 Acute pansinusitis, unspecified: Secondary | ICD-10-CM

## 2022-02-06 DIAGNOSIS — J3489 Other specified disorders of nose and nasal sinuses: Secondary | ICD-10-CM | POA: Diagnosis not present

## 2022-02-06 DIAGNOSIS — R0981 Nasal congestion: Secondary | ICD-10-CM | POA: Diagnosis not present

## 2022-02-06 DIAGNOSIS — R051 Acute cough: Secondary | ICD-10-CM

## 2022-02-06 DIAGNOSIS — R0982 Postnasal drip: Secondary | ICD-10-CM | POA: Diagnosis not present

## 2022-02-06 DIAGNOSIS — J22 Unspecified acute lower respiratory infection: Secondary | ICD-10-CM

## 2022-02-06 LAB — POCT INFLUENZA A/B
Influenza A, POC: NEGATIVE
Influenza B, POC: NEGATIVE

## 2022-02-06 LAB — POC COVID19 BINAXNOW: SARS Coronavirus 2 Ag: NEGATIVE

## 2022-02-06 MED ORDER — HYDROCODONE BIT-HOMATROP MBR 5-1.5 MG/5ML PO SOLN: 5.0000 mL | Freq: Three times a day (TID) | ORAL | 0 refills | Status: DC | PRN

## 2022-02-06 MED ORDER — AMOXICILLIN-POT CLAVULANATE 875-125 MG PO TABS: 1.0000 | ORAL_TABLET | Freq: Two times a day (BID) | ORAL | 0 refills | Status: DC

## 2022-02-06 NOTE — Patient Instructions (Signed)
Your COVID and flu tests are both negative in the office.  I am prescribing you an antibiotic called Augmentin for sinus infection and lower respiratory infection.  I recommend that you take a probiotic while you are on this medication or eat [place] yogurt with live cultures to replace the good bacteria in your stomach.  Take Tylenol or ibuprofen as needed for aches and pains.  Stay well-hydrated.  Take Mucinex DM as needed for cough and congestion.  Use salt water gargles and throat lozenges for sore throat.  I prescribed a cough medication called Hycodan which is sedating so avoid any other sedatives or alcohol with this medication.  Follow-up if you are getting worse or not back to baseline when you complete the antibiotic.

## 2022-05-14 DIAGNOSIS — Z1231 Encounter for screening mammogram for malignant neoplasm of breast: Secondary | ICD-10-CM | POA: Diagnosis not present

## 2022-05-14 LAB — HM MAMMOGRAPHY

## 2022-06-06 ENCOUNTER — Ambulatory Visit (INDEPENDENT_AMBULATORY_CARE_PROVIDER_SITE_OTHER): Payer: Medicare PPO

## 2022-06-06 ENCOUNTER — Encounter: Payer: Self-pay | Admitting: Family Medicine

## 2022-06-06 ENCOUNTER — Ambulatory Visit: Payer: Medicare PPO | Admitting: Family Medicine

## 2022-06-06 VITALS — BP 122/78 | HR 87 | Temp 97.6°F | Ht 64.0 in | Wt 146.0 lb

## 2022-06-06 DIAGNOSIS — R5383 Other fatigue: Secondary | ICD-10-CM | POA: Diagnosis not present

## 2022-06-06 DIAGNOSIS — R059 Cough, unspecified: Secondary | ICD-10-CM | POA: Diagnosis not present

## 2022-06-06 DIAGNOSIS — R0982 Postnasal drip: Secondary | ICD-10-CM | POA: Diagnosis not present

## 2022-06-06 DIAGNOSIS — R051 Acute cough: Secondary | ICD-10-CM

## 2022-06-06 DIAGNOSIS — D649 Anemia, unspecified: Secondary | ICD-10-CM

## 2022-06-06 DIAGNOSIS — J22 Unspecified acute lower respiratory infection: Secondary | ICD-10-CM

## 2022-06-06 LAB — CBC WITH DIFFERENTIAL/PLATELET
Basophils Absolute: 0 10*3/uL (ref 0.0–0.1)
Basophils Relative: 0.4 % (ref 0.0–3.0)
Eosinophils Absolute: 0 10*3/uL (ref 0.0–0.7)
Eosinophils Relative: 0.3 % (ref 0.0–5.0)
HCT: 39.9 % (ref 36.0–46.0)
Hemoglobin: 13.4 g/dL (ref 12.0–15.0)
Lymphocytes Relative: 17.5 % (ref 12.0–46.0)
Lymphs Abs: 1.5 10*3/uL (ref 0.7–4.0)
MCHC: 33.6 g/dL (ref 30.0–36.0)
MCV: 95.8 fl (ref 78.0–100.0)
Monocytes Absolute: 0.7 10*3/uL (ref 0.1–1.0)
Monocytes Relative: 8.4 % (ref 3.0–12.0)
Neutro Abs: 6.5 10*3/uL (ref 1.4–7.7)
Neutrophils Relative %: 73.4 % (ref 43.0–77.0)
Platelets: 213 10*3/uL (ref 150.0–400.0)
RBC: 4.17 Mil/uL (ref 3.87–5.11)
RDW: 14 % (ref 11.5–15.5)
WBC: 8.8 10*3/uL (ref 4.0–10.5)

## 2022-06-06 MED ORDER — FLUTICASONE PROPIONATE 50 MCG/ACT NA SUSP: 2.0000 | Freq: Every day | NASAL | 0 refills | Status: DC

## 2022-06-06 MED ORDER — AZITHROMYCIN 250 MG PO TABS: ORAL_TABLET | ORAL | 0 refills | Status: AC

## 2022-06-06 MED ORDER — BENZONATATE 200 MG PO CAPS: 200.0000 mg | ORAL_CAPSULE | Freq: Two times a day (BID) | ORAL | 0 refills | Status: DC | PRN

## 2022-06-06 NOTE — Addendum Note (Signed)
Addended by: Avanell Shackleton on: 06/06/2022 04:31 PM   Modules accepted: Orders

## 2022-06-06 NOTE — Patient Instructions (Addendum)
Please go downstairs for labs and a chest X ray.   I will send in an antibiotic and Tessalon Perles for you.   Continue Zyrtec and try Flonase (I prescribed this but it may be less expensive over the counter).   Drink plenty of water.   Continue Hycodan as needed at bedtime for the next few days only.   Follow up if you are not improving in the next week or if you develop any new or worsening symptoms.

## 2022-06-06 NOTE — Progress Notes (Addendum)
Subjective: Chief Complaint  Patient presents with   Cough    x3 weeks, wont seem to go away, taking zyrtec and cough syrup. Reports when she drinks something with carbonation it makes it worse     Rebecca Cameron is a 77 y.o. female who presents for a 3 week hx of NP cough and fatigue. Post nasal drainage. Unable to perform usual activities without tiring out.   Taking Zyrtec, Cold and Flu Alka seltzer, and Hycodan at bedtime prn.   Hx of bronchitis and pneumonia  Never smoked.   Hx of anemia. Is supposed to have CBC rechecked. Order is in from PCP  No other aggravating or relieving factors.  No other c/o.  ROS as in subjective.   Objective: Vitals:   06/06/22 1501  BP: 122/78  Pulse: 87  Temp: 97.6 F (36.4 C)  SpO2: 97%    General appearance: Alert, WD/WN, no distress, mildly ill appearing                             Skin: warm, no rash                           Head: no sinus tenderness                            Eyes: conjunctiva normal, corneas clear, PERRLA                            Ears: pearly TMs, external ear canals normal                          Nose: septum midline, turbinates swollen, with erythema              Mouth/throat: MMM, tongue normal, mild pharyngeal erythema                           Neck: supple, no adenopathy, no thyromegaly, nontender                          Heart: RRR                         Lungs: CTA bilaterally, no wheezes, rales, or rhonchi      Assessment: Lower respiratory infection - Plan: DG Chest 2 View, azithromycin (ZITHROMAX) 250 MG tablet  Post-nasal drainage - Plan: fluticasone (FLONASE) 50 MCG/ACT nasal spray  Fatigue, unspecified type - Plan: DG Chest 2 View  Acute cough - Plan: benzonatate (TESSALON) 200 MG capsule  Anemia, unspecified type - Plan: CBC with Differential/Platelet   Plan: CXR ordered. She has a hx of bronchitis and pneumonia. Coughing throughout visit. Exam unremarkable. Z-pak and Tessalon  ordered. I also sent in Flonase.  Suggested symptomatic OTC remedies including Zyrtec and Mucinex.  Call/return if worsening or not improving in the next week. Consider oral steroids if not improving.

## 2022-06-19 ENCOUNTER — Other Ambulatory Visit: Payer: Self-pay | Admitting: Internal Medicine

## 2022-08-27 ENCOUNTER — Other Ambulatory Visit: Payer: Self-pay | Admitting: Internal Medicine

## 2022-08-30 ENCOUNTER — Other Ambulatory Visit: Payer: Self-pay | Admitting: Internal Medicine

## 2022-09-01 MED ORDER — ZOLPIDEM TARTRATE 5 MG PO TABS: 5.0000 mg | ORAL_TABLET | Freq: Every evening | ORAL | 0 refills | Status: DC | PRN

## 2022-09-28 ENCOUNTER — Encounter: Payer: Self-pay | Admitting: Internal Medicine

## 2022-09-30 ENCOUNTER — Encounter: Payer: Self-pay | Admitting: Emergency Medicine

## 2022-09-30 ENCOUNTER — Ambulatory Visit: Payer: Medicare PPO | Admitting: Emergency Medicine

## 2022-09-30 ENCOUNTER — Other Ambulatory Visit: Payer: Self-pay | Admitting: Internal Medicine

## 2022-09-30 VITALS — BP 126/70 | HR 58 | Temp 97.9°F | Ht 64.0 in | Wt 145.2 lb

## 2022-09-30 DIAGNOSIS — H60392 Other infective otitis externa, left ear: Secondary | ICD-10-CM

## 2022-09-30 DIAGNOSIS — H6090 Unspecified otitis externa, unspecified ear: Secondary | ICD-10-CM | POA: Insufficient documentation

## 2022-09-30 DIAGNOSIS — H9202 Otalgia, left ear: Secondary | ICD-10-CM

## 2022-09-30 MED ORDER — CIPROFLOXACIN-DEXAMETHASONE 0.3-0.1 % OT SUSP
4.0000 [drp] | Freq: Two times a day (BID) | OTIC | 0 refills | Status: AC
Start: 2022-09-30 — End: 2022-10-05

## 2022-09-30 NOTE — Assessment & Plan Note (Signed)
Most likely secondary to frequent Q-tip use. Recommend topical Ciprodex twice a day for 5 days Advised to contact the office if no better or worse during the next several days

## 2022-09-30 NOTE — Assessment & Plan Note (Signed)
Secondary to infection of external canal Recommend topical antibiotic with corticosteroid Pain management discussed.  Recommend Tylenol for pain.

## 2022-09-30 NOTE — Progress Notes (Signed)
Rebecca Cameron 77 y.o.   Chief Complaint  Patient presents with   Medical Management of Chronic Issues    Patient states she may have a poss ear infection, left ear, throbbing pain radiating to her jaw x 1 week    Referral    Referral to dermatologist, wants to see someone for some skin issues     HISTORY OF PRESENT ILLNESS: This is a 77 y.o. female complaining of persistent left ear pain for the past week.  Progressively getting worse. Uses Q-tips daily after showering  HPI   Prior to Admission medications   Medication Sig Start Date End Date Taking? Authorizing Provider  ciprofloxacin-dexamethasone (CIPRODEX) OTIC suspension Place 4 drops into the left ear 2 (two) times daily for 5 days. 09/30/22 10/05/22 Yes Laurell Coalson, Eilleen Kempf, MD  losartan (COZAAR) 25 MG tablet Take 1 tablet (25 mg total) by mouth daily. 09/24/21   Pincus Sanes, MD  Multiple Vitamin (MULTIVITAMIN) capsule Take 1 capsule by mouth daily. 10/30/21   Pincus Sanes, MD  Probiotic Product (PROBIOTIC-10 PO) See admin instructions.    [provider]  rosuvastatin (CRESTOR) 20 MG tablet Take 1 tablet (20 mg total) by mouth daily. 09/24/21   Pincus Sanes, MD  zolpidem (AMBIEN) 5 MG tablet TAKE ONE TABLET BY MOUTH AT BEDTIME AS NEEDED FOR SLEEP 08/31/22   Etta Grandchild, MD  zolpidem (AMBIEN) 5 MG tablet Take 1 tablet (5 mg total) by mouth at bedtime as needed. for sleep 09/01/22   Maretta Bees, PA    No Known Allergies  Patient Active Problem List   Diagnosis Date Noted   Diverticular disease of colon 09/24/2021   Primary insomnia 09/24/2021   Difficulty maintaining weight 09/24/2021   Allergic dermatitis due to poison ivy 09/24/2021   Essential hypertension 09/22/2021   Vitamin B12 deficiency (non anemic) 09/22/2021   Osteopenia 09/22/2021   Pure hypercholesterolemia 07/19/2015   Cough 08/16/2013   GERD (gastroesophageal reflux disease) 08/16/2013   Olecranon bursitis of right elbow 05/27/2012    Closed fracture of thoracic vertebra (HCC) 08/03/2009   Low back pain 11/12/2007   RHUS DERMATITIS 07/05/2007   Migraine 03/15/2007    Past Medical History:  Diagnosis Date   Arthritis    Basal cell carcinoma 07/08/2005   right post shoulder tx curet x3 21fu   BCC (basal cell carcinoma of skin) 10/13/2007   left upper arm tx curet x3 70fu   Compression fracture of spine, non-traumatic (HCC)    Diverticulosis    Glaucoma    Migraine headache    Osteopenia    PMS (premenstrual syndrome)    SCC (squamous cell carcinoma) 03/27/2015   lateeral under left eye tx imiquimod   SCC (squamous cell carcinoma) 03/27/2015   medial under eye tx imiquimod   SCC (squamous cell carcinoma) 03/24/2016   left cheek TX cx3 80fu cautery, EXC   Squamous cell carcinoma of skin 03/27/2015   left angle of jaw tx imiquimod   Squamous cell carcinoma of skin 08/03/2019   in situ-left malar cheek (CX35FU)    Past Surgical History:  Procedure Laterality Date   CATARACT EXTRACTION  06-2014   COLONOSCOPY  2006   LUMBAR DISC SURGERY  2003    Social History   Socioeconomic History   Marital status: Single    Spouse name: Not on file   Number of children: 0   Years of education: Not on file   Highest education level: Not on  file  Occupational History   Occupation: retired  Tobacco Use   Smoking status: Never   Smokeless tobacco: Never  Substance and Sexual Activity   Alcohol use: Yes    Alcohol/week: 4.0 standard drinks of alcohol    Types: 2 Standard drinks or equivalent, 2 Cans of beer per week    Comment: OCC.   Drug use: No   Sexual activity: Not on file  Other Topics Concern   Not on file  Social History Narrative   Not on file   Social Determinants of Health   Financial Resource Strain: Low Risk  (09/30/2022)   Overall Financial Resource Strain (CARDIA)    Difficulty of Paying Living Expenses: Not hard at all  Food Insecurity: No Food Insecurity (09/30/2022)   Hunger Vital Sign     Worried About Running Out of Food in the Last Year: Never true    Ran Out of Food in the Last Year: Never true  Transportation Needs: No Transportation Needs (09/30/2022)   PRAPARE - Administrator, Civil Service (Medical): No    Lack of Transportation (Non-Medical): No  Physical Activity: Insufficiently Active (09/30/2022)   Exercise Vital Sign    Days of Exercise per Week: 3 days    Minutes of Exercise per Session: 30 min  Stress: No Stress Concern Present (09/30/2022)   [employer] of Occupational Health - Occupational Stress Questionnaire    Feeling of Stress : Only a little  Social Connections: Moderately Integrated (09/30/2022)   Social Connection and Isolation Panel [NHANES]    Frequency of Communication with Friends and Family: More than three times a week    Frequency of Social Gatherings with Friends and Family: Three times a week    Attends Religious Services: 1 to 4 times per year    Active Member of Clubs or Organizations: Yes    Attends Banker Meetings: More than 4 times per year    Marital Status: Never married  Intimate Partner Violence: Not At Risk (11/14/2021)   Humiliation, Afraid, Rape, and Kick questionnaire    Fear of Current or Ex-Partner: No    Emotionally Abused: No    Physically Abused: No    Sexually Abused: No    Family History  Problem Relation Age of Onset   Alzheimer's disease Mother    Hip fracture Father    Alcohol abuse Father    Glaucoma Sister    Alcohol abuse Brother    Ovarian cancer Sister    Colon cancer Neg Hx      Review of Systems  Constitutional: Negative.  Negative for chills and fever.  HENT:  Positive for ear pain. Negative for sore throat.   Respiratory: Negative.  Negative for cough.   Cardiovascular: Negative.  Negative for chest pain and palpitations.  Gastrointestinal:  Negative for abdominal pain, nausea and vomiting.  Skin: Negative.  Negative for rash.  Neurological: Negative.  Negative  for dizziness and headaches.  All other systems reviewed and are negative.   Vitals:   09/30/22 0906  BP: 126/70  Pulse: (!) 58  Temp: 97.9 F (36.6 C)  SpO2: 100%    Physical Exam Vitals reviewed.  Constitutional:      Appearance: Normal appearance.  HENT:     Head: Normocephalic.     Right Ear: Tympanic membrane, ear canal and external ear normal.     Left Ear: Tympanic membrane and external ear normal.     Ears:  Comments: Slightly hyperemic and tender floor of left external canal    Mouth/Throat:     Mouth: Mucous membranes are moist.     Pharynx: Oropharynx is clear.  Eyes:     Extraocular Movements: Extraocular movements intact.     Pupils: Pupils are equal, round, and reactive to light.  Cardiovascular:     Rate and Rhythm: Normal rate and regular rhythm.     Heart sounds: Normal heart sounds.  Pulmonary:     Effort: Pulmonary effort is normal.     Breath sounds: Normal breath sounds.  Musculoskeletal:     Cervical back: No tenderness.  Lymphadenopathy:     Cervical: No cervical adenopathy.  Skin:    General: Skin is warm and dry.     Capillary Refill: Capillary refill takes less than 2 seconds.  Neurological:     General: No focal deficit present.     Mental Status: She is alert and oriented to person, place, and time.  Psychiatric:        Mood and Affect: Mood normal.        Behavior: Behavior normal.      ASSESSMENT & PLAN: A total of 32 minutes was spent with the patient and counseling/coordination of care regarding preparing for this visit, review of most recent office visit notes, review of chronic medical conditions under management, review of all medications, diagnosis of left otitis externa and management including topical antibiotic/corticosteroid solution, pain management, prognosis, documentation, and need for follow-up if no better or worse during the next several days  Problem List Items Addressed This Visit       Nervous and Auditory    Acute otalgia, left - Primary    Secondary to infection of external canal Recommend topical antibiotic with corticosteroid Pain management discussed.  Recommend Tylenol for pain.      Relevant Medications   ciprofloxacin-dexamethasone (CIPRODEX) OTIC suspension   Otitis externa    Most likely secondary to frequent Q-tip use. Recommend topical Ciprodex twice a day for 5 days Advised to contact the office if no better or worse during the next several days      Relevant Medications   ciprofloxacin-dexamethasone (CIPRODEX) OTIC suspension   Patient Instructions  Earache, Adult An earache, or ear pain, can be caused by many things, including: An infection. Ear wax buildup. Ear pressure. Something in the ear that should not be there (foreign body). A sore throat. Tooth problems. Jaw problems. Treatment of the earache will depend on the cause. If the cause is not clear or cannot be known, you may need to watch your symptoms until your earache goes away or until a cause is found. Follow these instructions at home: Medicines Take or apply over-the-counter and prescription medicines only as told by your health care provider. If you were prescribed antibiotics, use them as told by your health care provider. Do not stop using the antibiotic even if you start to feel better. Do not put anything in your ear other than medicine that is prescribed by your health care provider. Managing pain     If directed, apply heat to the affected area as often as told by your health care provider. Use the heat source that your health care provider recommends, such as a moist heat pack or a heating pad. Place a towel between your skin and the heat source. Leave the heat on for 20-30 minutes. If your skin turns bright red, remove the heat right away to prevent burns. The risk  of burns is higher if you cannot feel pain, heat, or cold. If directed, put ice on the affected area. To do this: Put ice in a  plastic bag. Place a towel between your skin and the bag. Leave the ice on for 20 minutes, 2-3 times a day. If your skin turns bright red, remove the ice right away to prevent skin damage. The risk of skin damage is higher if you cannot feel pain, heat, or cold.  General instructions Pay attention to any changes in your symptoms. Try resting in an upright position instead of lying down. This may help to reduce pressure in your ear and relieve pain. Chew gum if it helps to relieve your ear pain. Treat any allergies as told by your health care provider. Drink enough fluid to keep your urine pale yellow. It is up to you to get the results of any tests that were done. Ask your health care provider, or the department that is doing the tests, when your results will be ready. Contact a health care provider if: Your pain does not improve within 2 days. Your earache gets worse. You have new symptoms. You have a fever. Get help right away if: You have a severe headache. You have a stiff neck. You have trouble swallowing. You have redness or swelling behind your ear. You have fluid or blood coming from your ear. You have hearing loss. You feel dizzy. This information is not intended to replace advice given to you by your health care provider. Make sure you discuss any questions you have with your health care provider. Document Revised: 06/17/2021 Document Reviewed: 06/17/2021 Elsevier Patient Education  2024 Elsevier Inc.      Edwina Barth, MD [facility]

## 2022-09-30 NOTE — Patient Instructions (Signed)
Earache, Adult An earache, or ear pain, can be caused by many things, including: An infection. Ear wax buildup. Ear pressure. Something in the ear that should not be there (foreign body). A sore throat. Tooth problems. Jaw problems. Treatment of the earache will depend on the cause. If the cause is not clear or cannot be known, you may need to watch your symptoms until your earache goes away or until a cause is found. Follow these instructions at home: Medicines Take or apply over-the-counter and prescription medicines only as told by your health care provider. If you were prescribed antibiotics, use them as told by your health care provider. Do not stop using the antibiotic even if you start to feel better. Do not put anything in your ear other than medicine that is prescribed by your health care provider. Managing pain     If directed, apply heat to the affected area as often as told by your health care provider. Use the heat source that your health care provider recommends, such as a moist heat pack or a heating pad. Place a towel between your skin and the heat source. Leave the heat on for 20-30 minutes. If your skin turns bright red, remove the heat right away to prevent burns. The risk of burns is higher if you cannot feel pain, heat, or cold. If directed, put ice on the affected area. To do this: Put ice in a plastic bag. Place a towel between your skin and the bag. Leave the ice on for 20 minutes, 2-3 times a day. If your skin turns bright red, remove the ice right away to prevent skin damage. The risk of skin damage is higher if you cannot feel pain, heat, or cold.  General instructions Pay attention to any changes in your symptoms. Try resting in an upright position instead of lying down. This may help to reduce pressure in your ear and relieve pain. Chew gum if it helps to relieve your ear pain. Treat any allergies as told by your health care provider. Drink enough fluid  to keep your urine pale yellow. It is up to you to get the results of any tests that were done. Ask your health care provider, or the department that is doing the tests, when your results will be ready. Contact a health care provider if: Your pain does not improve within 2 days. Your earache gets worse. You have new symptoms. You have a fever. Get help right away if: You have a severe headache. You have a stiff neck. You have trouble swallowing. You have redness or swelling behind your ear. You have fluid or blood coming from your ear. You have hearing loss. You feel dizzy. This information is not intended to replace advice given to you by your health care provider. Make sure you discuss any questions you have with your health care provider. Document Revised: 06/17/2021 Document Reviewed: 06/17/2021 Elsevier Patient Education  2024 Elsevier Inc.  

## 2022-10-01 DIAGNOSIS — H524 Presbyopia: Secondary | ICD-10-CM | POA: Diagnosis not present

## 2022-10-01 DIAGNOSIS — H40053 Ocular hypertension, bilateral: Secondary | ICD-10-CM | POA: Diagnosis not present

## 2022-10-01 DIAGNOSIS — H11441 Conjunctival cysts, right eye: Secondary | ICD-10-CM | POA: Diagnosis not present

## 2022-10-02 ENCOUNTER — Encounter: Payer: Self-pay | Admitting: Internal Medicine

## 2022-10-02 ENCOUNTER — Encounter (INDEPENDENT_AMBULATORY_CARE_PROVIDER_SITE_OTHER): Payer: Self-pay

## 2022-11-04 ENCOUNTER — Encounter: Payer: Medicare PPO | Admitting: Internal Medicine

## 2022-11-11 ENCOUNTER — Encounter: Payer: Self-pay | Admitting: Internal Medicine

## 2022-11-11 DIAGNOSIS — R7303 Prediabetes: Secondary | ICD-10-CM | POA: Insufficient documentation

## 2022-11-11 NOTE — Patient Instructions (Addendum)
Blood work was ordered.   The lab is on the first floor.    Medications changes include :   None    A referral was ordered [facility] and someone will call you to schedule an appointment.     Return in about 1 year (around 11/12/2023) for Schedule DEXA-[place], Physical Exam.    Health Maintenance, Female Adopting a healthy lifestyle and getting preventive care are important in promoting health and wellness. Ask your health care provider about: The right schedule for you to have regular tests and exams. Things you can do on your own to prevent diseases and keep yourself healthy. What should I know about diet, weight, and exercise? Eat a healthy diet  Eat a diet that includes plenty of vegetables, fruits, low-fat dairy products, and lean protein. Do not eat a lot of foods that are high in solid fats, added sugars, or sodium. Maintain a healthy weight Body mass index (BMI) is used to identify weight problems. It estimates body fat based on height and weight. Your health care provider can help determine your BMI and help you achieve or maintain a healthy weight. Get regular exercise Get regular exercise. This is one of the most important things you can do for your health. Most adults should: Exercise for at least 150 minutes each week. The exercise should increase your heart rate and make you sweat (moderate-intensity exercise). Do strengthening exercises at least twice a week. This is in addition to the moderate-intensity exercise. Spend less time sitting. Even light physical activity can be beneficial. Watch cholesterol and blood lipids Have your blood tested for lipids and cholesterol at 76 years of age, then have this test every 5 years. Have your cholesterol levels checked more often if: Your lipid or cholesterol levels are high. You are older than 76 years of age. You are at high risk for heart disease. What should I know about cancer screening? Depending on  your health history and family history, you may need to have cancer screening at various ages. This may include screening for: Breast cancer. Cervical cancer. Colorectal cancer. Skin cancer. Lung cancer. What should I know about heart disease, diabetes, and high blood pressure? Blood pressure and heart disease High blood pressure causes heart disease and increases the risk of stroke. This is more likely to develop in people who have high blood pressure readings or are overweight. Have your blood pressure checked: Every 3-5 years if you are 66-100 years of age. Every year if you are 64 years old or older. Diabetes Have regular diabetes screenings. This checks your fasting blood sugar level. Have the screening done: Once every three years after age 4 if you are at a normal weight and have a low risk for diabetes. More often and at a younger age if you are overweight or have a high risk for diabetes. What should I know about preventing infection? Hepatitis B If you have a higher risk for hepatitis B, you should be screened for this virus. Talk with your health care provider to find out if you are at risk for hepatitis B infection. Hepatitis C Testing is recommended for: Everyone born from [birth year] through 1964. Anyone with known risk factors for hepatitis C. Sexually transmitted infections (STIs) Get screened for STIs, including gonorrhea and chlamydia, if: You are sexually active and are younger than 76 years of age. You are older than 76 years of age and your health care provider tells you that you  are at risk for this type of infection. Your sexual activity has changed since you were last screened, and you are at increased risk for chlamydia or gonorrhea. Ask your health care provider if you are at risk. Ask your health care provider about whether you are at high risk for HIV. Your health care provider may recommend a prescription medicine to help prevent HIV infection. If you choose to take  medicine to prevent HIV, you should first get tested for HIV. You should then be tested every 3 months for as long as you are taking the medicine. Pregnancy If you are about to stop having your period (premenopausal) and you may become pregnant, seek counseling before you get pregnant. Take 400 to 800 micrograms (mcg) of folic acid every day if you become pregnant. Ask for birth control (contraception) if you want to prevent pregnancy. Osteoporosis and menopause Osteoporosis is a disease in which the bones lose minerals and strength with aging. This can result in bone fractures. If you are 40 years old or older, or if you are at risk for osteoporosis and fractures, ask your health care provider if you should: Be screened for bone loss. Take a calcium or vitamin D supplement to lower your risk of fractures. Be given hormone replacement therapy (HRT) to treat symptoms of menopause. Follow these instructions at home: Alcohol use Do not drink alcohol if: Your health care provider tells you not to drink. You are pregnant, may be pregnant, or are planning to become pregnant. If you drink alcohol: Limit how much you have to: 0-1 drink a day. Know how much alcohol is in your drink. In the [place] one drink equals one 12 oz bottle of beer (355 mL), one 5 oz glass of wine (148 mL), or one 1 oz glass of hard liquor (44 mL). Lifestyle Do not use any products that contain nicotine or tobacco. These products include cigarettes, chewing tobacco, and vaping devices, such as e-cigarettes. If you need help quitting, ask your health care provider. Do not use street drugs. Do not share needles. Ask your health care provider for help if you need support or information about quitting drugs. General instructions Schedule regular health, dental, and eye exams. Stay current with your vaccines. Tell your health care provider if: You often feel depressed. You have ever been abused or do not feel safe at  home. Summary Adopting a healthy lifestyle and getting preventive care are important in promoting health and wellness. Follow your health care provider's instructions about healthy diet, exercising, and getting tested or screened for diseases. Follow your health care provider's instructions on monitoring your cholesterol and blood pressure. This information is not intended to replace advice given to you by your health care provider. Make sure you discuss any questions you have with your health care provider. Document Revised: 06/25/2020 Document Reviewed: 06/25/2020 Elsevier Patient Education  2024 [employer].

## 2022-11-11 NOTE — Progress Notes (Unsigned)
Subjective:    Patient ID: Rebecca Cameron, female    DOB: December 27, 1945, 77 y.o.   MRN: [id number]      HPI Rebecca Cameron is here for a Physical exam and her chronic medical problems.    Dec gfr intermittently  Medications and allergies reviewed with patient and updated if appropriate.  Current Outpatient Medications on File Prior to Visit  Medication Sig Dispense Refill   losartan (COZAAR) 25 MG tablet TAKE ONE TABLET BY MOUTH ONE TIME DAILY 90 tablet 0   Multiple Vitamin (MULTIVITAMIN) capsule Take 1 capsule by mouth daily.     Probiotic Product (PROBIOTIC-10 PO) See admin instructions.     rosuvastatin (CRESTOR) 20 MG tablet Take 1 tablet (20 mg total) by mouth daily. 90 tablet 2   zolpidem (AMBIEN) 5 MG tablet TAKE ONE TABLET BY MOUTH AT BEDTIME AS NEEDED FOR SLEEP 30 tablet 0   zolpidem (AMBIEN) 5 MG tablet Take 1 tablet (5 mg total) by mouth at bedtime as needed. for sleep 30 tablet 0   No current facility-administered medications on file prior to visit.    Review of Systems     Objective:  There were no vitals filed for this visit. There were no vitals filed for this visit. There is no height or weight on file to calculate BMI.  BP Readings from Last 3 Encounters:  09/30/22 126/70  06/06/22 122/78  02/06/22 (!) 142/80    Wt Readings from Last 3 Encounters:  09/30/22 145 lb 4 oz (65.9 kg)  06/06/22 146 lb (66.2 kg)  02/06/22 143 lb (64.9 kg)       Physical Exam Constitutional: She appears well-developed and well-nourished. No distress.  HENT:  Head: Normocephalic and atraumatic.  Right Ear: External ear normal. Normal ear canal and TM Left Ear: External ear normal.  Normal ear canal and TM Mouth/Throat: Oropharynx is clear and moist.  Eyes: Conjunctivae normal.  Neck: Neck supple. No tracheal deviation present. No thyromegaly present.  No carotid bruit  Cardiovascular: Normal rate, regular rhythm and normal heart sounds.   No murmur heard.  No  edema. Pulmonary/Chest: Effort normal and breath sounds normal. No respiratory distress. She has no wheezes. She has no rales.  Breast: deferred   Abdominal: Soft. She exhibits no distension. There is no tenderness.  Lymphadenopathy: She has no cervical adenopathy.  Skin: Skin is warm and dry. She is not diaphoretic.  Psychiatric: She has a normal mood and affect. Her behavior is normal.     Lab Results  Component Value Date   WBC 8.8 06/06/2022   HGB 13.4 06/06/2022   HCT 39.9 06/06/2022   PLT 213.0 06/06/2022   GLUCOSE 86 10/30/2021   CHOL 155 10/30/2021   TRIG 66.0 10/30/2021   HDL 77.20 10/30/2021   LDLDIRECT 140.7 03/22/2012   LDLCALC 65 10/30/2021   ALT 15 10/30/2021   AST 19 10/30/2021   NA 140 10/30/2021   K 4.3 10/30/2021   CL 106 10/30/2021   CREATININE 1.03 10/30/2021   BUN 20 10/30/2021   CO2 25 10/30/2021   TSH 2.07 10/30/2021   HGBA1C 5.9 10/30/2021   MICROALBUR <0.7 07/19/2015         Assessment & Plan:   Physical exam: Screening blood work  ordered Exercise   Weight   Substance abuse  none   Reviewed recommended immunizations.   Health Maintenance  Topic Date Due   DEXA SCAN  08/08/2018   INFLUENZA VACCINE  09/18/2022  COVID-19 Vaccine (6 - 2023-24 season) 10/19/2022   Medicare Annual Wellness (AWV)  11/15/2022   DTaP/Tdap/Td (4 - Td or Tdap) 09/17/2026   Pneumonia Vaccine 69+ Years old  Completed   Hepatitis C Screening  Completed   HPV VACCINES  Aged Out   Colonoscopy  Discontinued   Zoster Vaccines- Shingrix  Discontinued          See Problem List for Assessment and Plan of chronic medical problems.

## 2022-11-12 ENCOUNTER — Ambulatory Visit: Payer: Medicare PPO | Admitting: Internal Medicine

## 2022-11-12 VITALS — BP 122/76 | HR 60 | Temp 98.0°F | Ht 64.0 in | Wt 144.0 lb

## 2022-11-12 DIAGNOSIS — F5101 Primary insomnia: Secondary | ICD-10-CM

## 2022-11-12 DIAGNOSIS — E7800 Pure hypercholesterolemia, unspecified: Secondary | ICD-10-CM | POA: Diagnosis not present

## 2022-11-12 DIAGNOSIS — Z0000 Encounter for general adult medical examination without abnormal findings: Secondary | ICD-10-CM | POA: Diagnosis not present

## 2022-11-12 DIAGNOSIS — M8589 Other specified disorders of bone density and structure, multiple sites: Secondary | ICD-10-CM

## 2022-11-12 DIAGNOSIS — E538 Deficiency of other specified B group vitamins: Secondary | ICD-10-CM

## 2022-11-12 DIAGNOSIS — I10 Essential (primary) hypertension: Secondary | ICD-10-CM

## 2022-11-12 DIAGNOSIS — R7303 Prediabetes: Secondary | ICD-10-CM | POA: Diagnosis not present

## 2022-11-12 DIAGNOSIS — Z1283 Encounter for screening for malignant neoplasm of skin: Secondary | ICD-10-CM | POA: Diagnosis not present

## 2022-11-12 DIAGNOSIS — R944 Abnormal results of kidney function studies: Secondary | ICD-10-CM

## 2022-11-12 LAB — CBC WITH DIFFERENTIAL/PLATELET
Basophils Absolute: 0.1 10*3/uL (ref 0.0–0.1)
Basophils Relative: 1.1 % (ref 0.0–3.0)
Eosinophils Absolute: 0.1 10*3/uL (ref 0.0–0.7)
Eosinophils Relative: 1.1 % (ref 0.0–5.0)
HCT: 40.8 % (ref 36.0–46.0)
Hemoglobin: 13.4 g/dL (ref 12.0–15.0)
Lymphocytes Relative: 24.6 % (ref 12.0–46.0)
Lymphs Abs: 1.4 10*3/uL (ref 0.7–4.0)
MCHC: 33 g/dL (ref 30.0–36.0)
MCV: 96 fl (ref 78.0–100.0)
Monocytes Absolute: 0.7 10*3/uL (ref 0.1–1.0)
Monocytes Relative: 11.5 % (ref 3.0–12.0)
Neutro Abs: 3.6 10*3/uL (ref 1.4–7.7)
Neutrophils Relative %: 61.7 % (ref 43.0–77.0)
Platelets: 246 10*3/uL (ref 150.0–400.0)
RBC: 4.25 Mil/uL (ref 3.87–5.11)
RDW: 13.2 % (ref 11.5–15.5)
WBC: 5.8 10*3/uL (ref 4.0–10.5)

## 2022-11-12 LAB — COMPREHENSIVE METABOLIC PANEL
ALT: 14 U/L (ref 0–35)
AST: 19 U/L (ref 0–37)
Albumin: 4.5 g/dL (ref 3.5–5.2)
Alkaline Phosphatase: 52 U/L (ref 39–117)
BUN: 20 mg/dL (ref 6–23)
CO2: 26 mEq/L (ref 19–32)
Calcium: 9.8 mg/dL (ref 8.4–10.5)
Chloride: 103 mEq/L (ref 96–112)
Creatinine, Ser: 1.23 mg/dL — ABNORMAL HIGH (ref 0.40–1.20)
GFR: 42.8 mL/min — ABNORMAL LOW (ref >60.00)
Glucose, Bld: 93 mg/dL (ref 70–99)
Potassium: 4.3 mEq/L (ref 3.5–5.1)
Sodium: 138 mEq/L (ref 135–145)
Total Bilirubin: 0.6 mg/dL (ref 0.2–1.2)
Total Protein: 7.8 g/dL (ref 6.0–8.3)

## 2022-11-12 LAB — LIPID PANEL
Cholesterol: 174 mg/dL (ref 0–200)
HDL: 77.1 mg/dL (ref >39.00)
LDL Cholesterol: 78 mg/dL (ref 0–99)
NonHDL: 97.32
Total CHOL/HDL Ratio: 2
Triglycerides: 95 mg/dL (ref 0.0–149.0)
VLDL: 19 mg/dL (ref 0.0–40.0)

## 2022-11-12 LAB — VITAMIN B12: Vitamin B-12: 562 pg/mL (ref 211–911)

## 2022-11-12 LAB — HEMOGLOBIN A1C: Hgb A1c MFr Bld: 6 % (ref 4.6–6.5)

## 2022-11-12 LAB — TSH: TSH: 1.66 u[IU]/mL (ref 0.35–5.50)

## 2022-11-12 MED ORDER — ZOLPIDEM TARTRATE 5 MG PO TABS: 5.0000 mg | ORAL_TABLET | Freq: Every evening | ORAL | 5 refills | Status: DC | PRN

## 2022-11-12 MED ORDER — LOSARTAN POTASSIUM 25 MG PO TABS: 25.0000 mg | ORAL_TABLET | Freq: Every day | ORAL | 2 refills | Status: DC

## 2022-11-12 NOTE — Assessment & Plan Note (Addendum)
Chronic Lab Results  Component Value Date   HGBA1C 5.9 10/30/2021   Check a1c Low sugar / carb diet Stressed regular exercise

## 2022-11-12 NOTE — Assessment & Plan Note (Signed)
Chronic BP well controlled Continue losartan 25 mg daily cmp, CBC

## 2022-11-12 NOTE — Assessment & Plan Note (Signed)
Chronic Intermittent Uses ambien 5 mg nightly prn only - continue

## 2022-11-12 NOTE — Assessment & Plan Note (Signed)
Chronic ?Check B12 level ?

## 2022-11-12 NOTE — Assessment & Plan Note (Signed)
Chronic dexa due-order H/o compression fx Taking multivitamin daily Continue regular exercise

## 2022-11-12 NOTE — Assessment & Plan Note (Addendum)
Chronic Check lipid panel, CMP, TSH Continue crestor 20 mg TIW Regular exercise and healthy diet encouraged

## 2022-11-16 NOTE — Addendum Note (Signed)
Addended by: Pincus Sanes on: 11/16/2022 03:27 PM   Modules accepted: Orders

## 2022-11-17 ENCOUNTER — Ambulatory Visit
Admission: RE | Admit: 2022-11-17 | Discharge: 2022-11-17 | Disposition: A | Discharge status: Home / Self Care | Payer: Medicare PPO | Source: Ambulatory Visit | Attending: Internal Medicine

## 2022-11-17 DIAGNOSIS — M8589 Other specified disorders of bone density and structure, multiple sites: Secondary | ICD-10-CM | POA: Diagnosis not present

## 2022-11-21 ENCOUNTER — Ambulatory Visit
Admission: RE | Admit: 2022-11-21 | Discharge: 2022-11-21 | Disposition: A | Discharge status: Home / Self Care | Payer: Medicare PPO | Source: Ambulatory Visit | Attending: Internal Medicine

## 2022-11-21 DIAGNOSIS — N1330 Unspecified hydronephrosis: Secondary | ICD-10-CM | POA: Diagnosis not present

## 2022-11-21 DIAGNOSIS — R944 Abnormal results of kidney function studies: Secondary | ICD-10-CM

## 2022-11-23 ENCOUNTER — Encounter: Payer: Self-pay | Admitting: Internal Medicine

## 2022-12-08 ENCOUNTER — Encounter: Payer: Self-pay | Admitting: Internal Medicine

## 2022-12-08 NOTE — Patient Instructions (Incomplete)
    Have blood work done today   Medications changes include :   start taking calcium and vitamin d daily.     Take 500-600 mg of calcium and 1000 - 2000 units of vitamin d daily   We will recheck your bone density in 2 years.

## 2022-12-08 NOTE — Progress Notes (Signed)
Subjective:    Patient ID: [name], female    DOB: [birth year], 77 y.o.   MRN: [id number]     HPI Rebecca Cameron is here for follow up of her chronic medical problems.  11/17/22 - spine -0.7, (+3.6%*),  RFN  -1.5, LFN -1.7 (-0.5%)    frax   23.1%,  6.3%  ( hx of compression fx)  Took Fosamax yrs ago  2012 - took forteo or something similar chronic   Not taking any calcium and vitamin D at this time.  She is active and exercises.  Medications and allergies reviewed with patient and updated if appropriate.  Current Outpatient Medications on File Prior to Visit  Medication Sig Dispense Refill   losartan (COZAAR) 25 MG tablet Take 1 tablet (25 mg total) by mouth daily. 90 tablet 2   Multiple Vitamin (MULTIVITAMIN) capsule Take 1 capsule by mouth daily.     Probiotic Product (PROBIOTIC-10 PO) See admin instructions.     rosuvastatin (CRESTOR) 20 MG tablet Take 1 tablet (20 mg total) by mouth daily. 90 tablet 2   zolpidem (AMBIEN) 5 MG tablet Take 1 tablet (5 mg total) by mouth at bedtime as needed. for sleep 30 tablet 5   No current facility-administered medications on file prior to visit.     Review of Systems     Objective:   Vitals:   12/09/22 1044  BP: 118/70  Pulse: 70  Temp: 98 F (36.7 C)  SpO2: 97%   BP Readings from Last 3 Encounters:  12/09/22 118/70  11/12/22 122/76  09/30/22 126/70   Wt Readings from Last 3 Encounters:  12/09/22 142 lb (64.4 kg)  11/12/22 144 lb (65.3 kg)  09/30/22 145 lb 4 oz (65.9 kg)   Body mass index is 24.37 kg/m.    Physical Exam     Lab Results  Component Value Date   WBC 5.8 11/12/2022   HGB 13.4 11/12/2022   HCT 40.8 11/12/2022   PLT 246.0 11/12/2022   GLUCOSE 98 12/09/2022   CHOL 174 11/12/2022   TRIG 95.0 11/12/2022   HDL 77.10 11/12/2022   LDLDIRECT 140.7 03/22/2012   LDLCALC 78 11/12/2022   ALT 14 11/12/2022   AST 19 11/12/2022   NA 140 12/09/2022   K 5.5 No hemolysis seen (H) 12/09/2022   CL 106  12/09/2022   CREATININE 1.03 12/09/2022   BUN 22 12/09/2022   CO2 26 12/09/2022   TSH 1.66 11/12/2022   HGBA1C 6.0 11/12/2022   MICROALBUR <0.7 07/19/2015     Assessment & Plan:    See Problem List for Assessment and Plan of chronic medical problems.

## 2022-12-09 ENCOUNTER — Ambulatory Visit: Payer: Medicare PPO | Admitting: Internal Medicine

## 2022-12-09 VITALS — BP 118/70 | HR 70 | Temp 98.0°F | Ht 64.0 in | Wt 142.0 lb

## 2022-12-09 DIAGNOSIS — N1832 Chronic kidney disease, stage 3b: Secondary | ICD-10-CM | POA: Diagnosis not present

## 2022-12-09 DIAGNOSIS — I10 Essential (primary) hypertension: Secondary | ICD-10-CM | POA: Diagnosis not present

## 2022-12-09 DIAGNOSIS — M8589 Other specified disorders of bone density and structure, multiple sites: Secondary | ICD-10-CM | POA: Diagnosis not present

## 2022-12-09 LAB — BASIC METABOLIC PANEL
BUN: 22 mg/dL (ref 6–23)
CO2: 26 meq/L (ref 19–32)
Calcium: 9.8 mg/dL (ref 8.4–10.5)
Chloride: 106 meq/L (ref 96–112)
Creatinine, Ser: 1.03 mg/dL (ref 0.40–1.20)
GFR: 52.92 mL/min — ABNORMAL LOW (ref >60.00)
Glucose, Bld: 98 mg/dL (ref 70–99)
Potassium: 5.5 meq/L — ABNORMAL HIGH (ref 3.5–5.1)
Sodium: 140 meq/L (ref 135–145)

## 2022-12-09 LAB — VITAMIN D 25 HYDROXY (VIT D DEFICIENCY, FRACTURES): VITD: 42.11 ng/mL (ref 30.00–100.00)

## 2022-12-09 NOTE — Assessment & Plan Note (Addendum)
Point did have osteoporosis Bone density relatively stable for the past 7 years which is fairly amazing FRAX is high likely related to history of compression fracture-she is not sure exactly how that happened-no major injury Not taking calcium and vitamin D-advised to start 500-600 mg of calcium daily and continue high calcium diet Start 1000-2000 units of vitamin D daily Check vitamin D level Continue regular exercise No treatment needed at this time DEXA in 2 years

## 2022-12-09 NOTE — Assessment & Plan Note (Signed)
Chronic BP well controlled Continue losartan 25 mg daily

## 2022-12-09 NOTE — Assessment & Plan Note (Addendum)
?    Decreased GFR cause BMP today Ultrasound of kidneys unremarkable for cause of decreased GFR Will consider referral to nephrology

## 2022-12-10 DIAGNOSIS — H02421 Myogenic ptosis of right eyelid: Secondary | ICD-10-CM | POA: Diagnosis not present

## 2022-12-10 DIAGNOSIS — H0279 Other degenerative disorders of eyelid and periocular area: Secondary | ICD-10-CM | POA: Diagnosis not present

## 2022-12-10 DIAGNOSIS — H02422 Myogenic ptosis of left eyelid: Secondary | ICD-10-CM | POA: Diagnosis not present

## 2022-12-10 DIAGNOSIS — H02413 Mechanical ptosis of bilateral eyelids: Secondary | ICD-10-CM | POA: Diagnosis not present

## 2022-12-10 DIAGNOSIS — H02423 Myogenic ptosis of bilateral eyelids: Secondary | ICD-10-CM | POA: Diagnosis not present

## 2022-12-10 DIAGNOSIS — H02834 Dermatochalasis of left upper eyelid: Secondary | ICD-10-CM | POA: Diagnosis not present

## 2022-12-10 DIAGNOSIS — H02411 Mechanical ptosis of right eyelid: Secondary | ICD-10-CM | POA: Diagnosis not present

## 2022-12-10 DIAGNOSIS — H02412 Mechanical ptosis of left eyelid: Secondary | ICD-10-CM | POA: Diagnosis not present

## 2022-12-10 DIAGNOSIS — H02831 Dermatochalasis of right upper eyelid: Secondary | ICD-10-CM | POA: Diagnosis not present

## 2022-12-24 ENCOUNTER — Other Ambulatory Visit: Payer: Self-pay | Admitting: Internal Medicine

## 2022-12-30 ENCOUNTER — Telehealth: Payer: Self-pay | Admitting: Internal Medicine

## 2022-12-30 NOTE — Telephone Encounter (Signed)
Pt mentioned she wants to talk with her nurse Albin Felling personally "its personal". However, I asked for her info and started over asking her "how can I help you" she then says "oh nothing I need to speak with my nurse Albin Felling, is she available to talk?". I said unfortunately no ma'am but I can send her a msg to call you back. I then ask her again was there anything in specific she needs to know before giving you a call back she then says, she wants to catch on some things with you.

## 2022-12-31 NOTE — Telephone Encounter (Signed)
Patient came by office today.

## 2023-01-12 ENCOUNTER — Encounter: Payer: Self-pay | Admitting: Internal Medicine

## 2023-01-12 ENCOUNTER — Ambulatory Visit: Payer: Medicare PPO | Admitting: Internal Medicine

## 2023-01-12 VITALS — BP 126/72 | HR 72 | Temp 98.0°F | Ht 64.0 in

## 2023-01-12 DIAGNOSIS — J019 Acute sinusitis, unspecified: Secondary | ICD-10-CM | POA: Diagnosis not present

## 2023-01-12 DIAGNOSIS — I10 Essential (primary) hypertension: Secondary | ICD-10-CM

## 2023-01-12 MED ORDER — AZITHROMYCIN 250 MG PO TABS: ORAL_TABLET | ORAL | 0 refills | Status: DC

## 2023-01-12 MED ORDER — HYDROCODONE BIT-HOMATROP MBR 5-1.5 MG/5ML PO SOLN: 5.0000 mL | Freq: Three times a day (TID) | ORAL | 0 refills | Status: DC | PRN

## 2023-01-12 MED ORDER — CEFTRIAXONE SODIUM 1 G IJ SOLR
1.0000 g | Freq: Once | INTRAMUSCULAR | Status: AC
Start: 2023-01-12 — End: 2023-01-12
  Administered 2023-01-12: 1 g via INTRAMUSCULAR

## 2023-01-12 MED ORDER — CEFDINIR 300 MG PO CAPS: 300.0000 mg | ORAL_CAPSULE | Freq: Two times a day (BID) | ORAL | 0 refills | Status: DC

## 2023-01-12 NOTE — Assessment & Plan Note (Signed)
Acute Symptoms ongoing for 3 weeks Concern with possibly early bronchitis versus early pneumonia Ceftriaxone 1 g IM x 1 today Start antibiotics tomorrow-Omnicef 300 mg twice daily x 10 days, Z-Pak Hycodan cough syrup Can continue over-the-counter cold medications as needed Rest, fluids Call if no improvement

## 2023-01-12 NOTE — Progress Notes (Signed)
    Subjective:    Patient ID: Rebecca Cameron, female    DOB: 1945-02-26, 77 y.o.   MRN: [id number]      HPI Rebecca Cameron is here for  Chief Complaint  Patient presents with   Nasal Congestion    Nasal congestion x 3 weeks     Symptoms started 3 weeks ago and have not improved/resolved.  She states decreased appetite, significant fatigue, and nasal congestion, runny nose, sinus pressure, cough that is productive of white sputum, some mild shortness of breath, dizziness and headaches  Alka seltzer cold, mucinex, tylenol cold and sinus   Medications and allergies reviewed with patient and updated if appropriate.  Current Outpatient Medications on File Prior to Visit  Medication Sig Dispense Refill   losartan (COZAAR) 25 MG tablet Take 1 tablet (25 mg total) by mouth daily. 90 tablet 2   Multiple Vitamin (MULTIVITAMIN) capsule Take 1 capsule by mouth daily.     Probiotic Product (PROBIOTIC-10 PO) See admin instructions.     rosuvastatin (CRESTOR) 20 MG tablet TAKE ONE TABLET BY MOUTH ONE TIME DAILY 90 tablet 0   zolpidem (AMBIEN) 5 MG tablet Take 1 tablet (5 mg total) by mouth at bedtime as needed. for sleep 30 tablet 5   No current facility-administered medications on file prior to visit.    Review of Systems  Constitutional:  Positive for appetite change (little dec) and fatigue. Negative for fever.  HENT:  Positive for congestion, rhinorrhea and sinus pressure (teeth have hurt). Negative for ear pain and sore throat.   Respiratory:  Positive for cough (clear phlegm) and shortness of breath. Negative for wheezing.   Neurological:  Positive for dizziness and headaches.       Objective:   Vitals:   01/12/23 1615  BP: 126/72  Pulse: 72  Temp: 98 F (36.7 C)  SpO2: 97%   BP Readings from Last 3 Encounters:  01/12/23 126/72  12/09/22 118/70  11/12/22 122/76   Wt Readings from Last 3 Encounters:  12/09/22 142 lb (64.4 kg)  11/12/22 144 lb (65.3 kg)  09/30/22 145 lb 4  oz (65.9 kg)   Body mass index is 24.37 kg/m.    Physical Exam Constitutional:      General: She is not in acute distress.    Appearance: Normal appearance. She is not ill-appearing.  HENT:     Head: Normocephalic and atraumatic.     Right Ear: Tympanic membrane, ear canal and external ear normal.     Left Ear: Tympanic membrane, ear canal and external ear normal.     Mouth/Throat:     Mouth: Mucous membranes are moist.     Pharynx: No oropharyngeal exudate or posterior oropharyngeal erythema.  Eyes:     Conjunctiva/sclera: Conjunctivae normal.  Cardiovascular:     Rate and Rhythm: Normal rate and regular rhythm.  Pulmonary:     Effort: Pulmonary effort is normal. No respiratory distress.     Breath sounds: Normal breath sounds. No wheezing or rales.  Musculoskeletal:     Cervical back: Neck supple. No tenderness.  Lymphadenopathy:     Cervical: No cervical adenopathy.  Skin:    General: Skin is warm and dry.  Neurological:     Mental Status: She is alert.            Assessment & Plan:    See Problem List for Assessment and Plan of chronic medical problems.

## 2023-01-12 NOTE — Assessment & Plan Note (Signed)
Chronic BP well controlled Continue losartan 25 mg daily

## 2023-01-12 NOTE — Patient Instructions (Addendum)
       Medications changes include :   two antibiotics, cough syrup for nighttime     Return if symptoms worsen or fail to improve.

## 2023-02-03 DIAGNOSIS — X32XXXA Exposure to sunlight, initial encounter: Secondary | ICD-10-CM | POA: Diagnosis not present

## 2023-02-03 DIAGNOSIS — L821 Other seborrheic keratosis: Secondary | ICD-10-CM | POA: Diagnosis not present

## 2023-02-03 DIAGNOSIS — L570 Actinic keratosis: Secondary | ICD-10-CM | POA: Diagnosis not present

## 2023-03-20 ENCOUNTER — Other Ambulatory Visit: Payer: Self-pay | Admitting: Internal Medicine

## 2023-05-20 DIAGNOSIS — Z1231 Encounter for screening mammogram for malignant neoplasm of breast: Secondary | ICD-10-CM | POA: Diagnosis not present

## 2023-05-20 LAB — HM MAMMOGRAPHY

## 2023-05-21 ENCOUNTER — Encounter: Payer: Self-pay | Admitting: Internal Medicine

## 2023-06-17 ENCOUNTER — Other Ambulatory Visit: Payer: Self-pay | Admitting: Internal Medicine

## 2023-06-17 DIAGNOSIS — L821 Other seborrheic keratosis: Secondary | ICD-10-CM | POA: Diagnosis not present

## 2023-06-17 DIAGNOSIS — L308 Other specified dermatitis: Secondary | ICD-10-CM | POA: Diagnosis not present

## 2023-06-17 DIAGNOSIS — X32XXXD Exposure to sunlight, subsequent encounter: Secondary | ICD-10-CM | POA: Diagnosis not present

## 2023-06-17 DIAGNOSIS — L570 Actinic keratosis: Secondary | ICD-10-CM | POA: Diagnosis not present

## 2023-06-18 ENCOUNTER — Other Ambulatory Visit: Payer: Self-pay | Admitting: Internal Medicine

## 2023-10-01 ENCOUNTER — Encounter: Payer: Self-pay | Admitting: Internal Medicine

## 2023-10-01 ENCOUNTER — Ambulatory Visit: Admitting: Internal Medicine

## 2023-10-01 VITALS — BP 116/68 | HR 78 | Temp 98.1°F | Ht 64.0 in | Wt 146.0 lb

## 2023-10-01 DIAGNOSIS — I10 Essential (primary) hypertension: Secondary | ICD-10-CM | POA: Diagnosis not present

## 2023-10-01 DIAGNOSIS — H40053 Ocular hypertension, bilateral: Secondary | ICD-10-CM | POA: Diagnosis not present

## 2023-10-01 DIAGNOSIS — H60502 Unspecified acute noninfective otitis externa, left ear: Secondary | ICD-10-CM | POA: Diagnosis not present

## 2023-10-01 DIAGNOSIS — R7303 Prediabetes: Secondary | ICD-10-CM | POA: Diagnosis not present

## 2023-10-01 DIAGNOSIS — H40013 Open angle with borderline findings, low risk, bilateral: Secondary | ICD-10-CM | POA: Diagnosis not present

## 2023-10-01 DIAGNOSIS — H6092 Unspecified otitis externa, left ear: Secondary | ICD-10-CM | POA: Insufficient documentation

## 2023-10-01 DIAGNOSIS — H11441 Conjunctival cysts, right eye: Secondary | ICD-10-CM | POA: Diagnosis not present

## 2023-10-01 DIAGNOSIS — H43393 Other vitreous opacities, bilateral: Secondary | ICD-10-CM | POA: Diagnosis not present

## 2023-10-01 MED ORDER — CIPROFLOXACIN HCL 500 MG PO TABS: 500.0000 mg | ORAL_TABLET | Freq: Two times a day (BID) | ORAL | 0 refills | Status: AC

## 2023-10-01 MED ORDER — CIPROFLOXACIN-DEXAMETHASONE 0.3-0.1 % OT SUSP: 4.0000 [drp] | Freq: Two times a day (BID) | OTIC | 0 refills | Status: DC

## 2023-10-01 NOTE — Assessment & Plan Note (Signed)
 BP Readings from Last 3 Encounters:  10/01/23 116/68  01/12/23 126/72  12/09/22 118/70   Stable, pt to continue medical treatment losartan  25 mg qd

## 2023-10-01 NOTE — Assessment & Plan Note (Signed)
 Lab Results  Component Value Date   HGBA1C 6.0 11/12/2022   Stable, pt to continue current medical treatment  - diet,wt control

## 2023-10-01 NOTE — Patient Instructions (Signed)
 Please take all new medication as prescribed - the drops antibiotic, and the pill antibiotic  Please continue all other medications as before, and refills have been done if requested.  Please have the pharmacy call with any other refills you may need.  Please keep your appointments with your specialists as you may have planned

## 2023-10-01 NOTE — Progress Notes (Signed)
 Patient ID: Rebecca Cameron, female   DOB: [birth year], 78 y.o.   MRN: [id number]        Chief Complaint: follow up left ext otitis       HPI:  Rebecca Cameron is a 78 y.o. female here with c/o left ear discomfort without blood or d/c, and no fever, allergies, but does have occasional left tinnitus as well.  Pt denies chest pain, increased sob or doe, wheezing, orthopnea, PND, increased LE swelling, palpitations, dizziness or syncope.   Pt denies polydipsia, polyuria, or new focal neuro s/s.          Wt Readings from Last 3 Encounters:  10/01/23 146 lb (66.2 kg)  12/09/22 142 lb (64.4 kg)  11/12/22 144 lb (65.3 kg)   BP Readings from Last 3 Encounters:  10/01/23 116/68  01/12/23 126/72  12/09/22 118/70         Past Medical History:  Diagnosis Date   Arthritis    Basal cell carcinoma 07/08/2005   right post shoulder tx curet x3 48fu   BCC (basal cell carcinoma of skin) 10/13/2007   left upper arm tx curet x3 60fu   Compression fracture of spine, non-traumatic (HCC)    Diverticulosis    Glaucoma    Migraine headache    Osteopenia    PMS (premenstrual syndrome)    SCC (squamous cell carcinoma) 03/27/2015   lateeral under left eye tx imiquimod   SCC (squamous cell carcinoma) 03/27/2015   medial under eye tx imiquimod   SCC (squamous cell carcinoma) 03/24/2016   left cheek TX cx3 26fu cautery, EXC   Squamous cell carcinoma of skin 03/27/2015   left angle of jaw tx imiquimod   Squamous cell carcinoma of skin 08/03/2019   in situ-left malar cheek (CX35FU)   Past Surgical History:  Procedure Laterality Date   CATARACT EXTRACTION  06-2014   COLONOSCOPY  2006   LUMBAR DISC SURGERY  2003    reports that she has never smoked. She has never used smokeless tobacco. She reports current alcohol use of about 4.0 standard drinks of alcohol per week. She reports that she does not use drugs. family history includes Alcohol abuse in her brother and father; Alzheimer's disease in her mother;  Glaucoma in her sister; Hip fracture in her father; Ovarian cancer in her sister. No Known Allergies Current Outpatient Medications on File Prior to Visit  Medication Sig Dispense Refill   azithromycin  (ZITHROMAX ) 250 MG tablet Take two tabs the first day and then one tab daily for four days 6 tablet 0   cefdinir  (OMNICEF ) 300 MG capsule Take 1 capsule (300 mg total) by mouth 2 (two) times daily. 20 capsule 0   HYDROcodone  bit-homatropine (HYCODAN) 5-1.5 MG/5ML syrup Take 5 mLs by mouth every 8 (eight) hours as needed for cough. 120 mL 0   losartan  (COZAAR ) 25 MG tablet Take 1 tablet (25 mg total) by mouth daily. 90 tablet 2   Multiple Vitamin (MULTIVITAMIN) capsule Take 1 capsule by mouth daily.     Probiotic Product (PROBIOTIC-10 PO) See admin instructions.     rosuvastatin  (CRESTOR ) 20 MG tablet TAKE ONE TABLET BY MOUTH ONE TIME DAILY 90 tablet 0   zolpidem  (AMBIEN ) 5 MG tablet TAKE ONE TABLET BY MOUTH DAILY AT BEDTIME AS NEEDED FOR SLEEP 30 tablet 3   No current facility-administered medications on file prior to visit.        ROS:  All others reviewed and negative.  Objective  PE:  BP 116/68 (BP Location: Left Arm, Patient Position: Sitting, Cuff Size: Normal)   Pulse 78   Temp 98.1 F (36.7 C) (Oral)   Ht 5' 4 (1.626 m)   Wt 146 lb (66.2 kg)   SpO2 98%   BMI 25.06 kg/m                 Constitutional: Pt appears non toxic               HENT: Head: NCAT.                Right Ear: External ear normal.                 Left Ear: External ear normal. Left canal with 1+ redness, swelling without blood or other d/c               Eyes: . Pupils are equal, round, and reactive to light. Conjunctivae and EOM are normal               Nose: without d/c or deformity               Neck: Neck supple. Gross normal ROM               Cardiovascular: Normal rate and regular rhythm.                 Pulmonary/Chest: Effort normal and breath sounds without rales or wheezing.                               Neurological: Pt is alert. At baseline orientation, motor grossly intact               Skin: Skin is warm. No rashes, no other new lesions, LE edema - none               Psychiatric: Pt behavior is normal without agitation   Micro: none  Cardiac tracings I have personally interpreted today:  none  Pertinent Radiological findings (summarize): none   Lab Results  Component Value Date   WBC 5.8 11/12/2022   HGB 13.4 11/12/2022   HCT 40.8 11/12/2022   PLT 246.0 11/12/2022   GLUCOSE 98 12/09/2022   CHOL 174 11/12/2022   TRIG 95.0 11/12/2022   HDL 77.10 11/12/2022   LDLDIRECT 140.7 03/22/2012   LDLCALC 78 11/12/2022   ALT 14 11/12/2022   AST 19 11/12/2022   NA 140 12/09/2022   K 5.5 No hemolysis seen (H) 12/09/2022   CL 106 12/09/2022   CREATININE 1.03 12/09/2022   BUN 22 12/09/2022   CO2 26 12/09/2022   TSH 1.66 11/12/2022   HGBA1C 6.0 11/12/2022   Assessment/Plan:  Rebecca Cameron is a 78 y.o. White or Caucasian [1] female with  has a past medical history of Arthritis, Basal cell carcinoma (07/08/2005), BCC (basal cell carcinoma of skin) (10/13/2007), Compression fracture of spine, non-traumatic (HCC), Diverticulosis, Glaucoma, Migraine headache, Osteopenia, PMS (premenstrual syndrome), SCC (squamous cell carcinoma) (03/27/2015), SCC (squamous cell carcinoma) (03/27/2015), SCC (squamous cell carcinoma) (03/24/2016), Squamous cell carcinoma of skin (03/27/2015), and Squamous cell carcinoma of skin (08/03/2019).  External otitis of left ear Mild to mod, for cipro  500, also trial ciprodex  if not too expensive per pt request,  to f/u any worsening symptoms or concerns  Prediabetes Lab Results  Component Value Date   HGBA1C 6.0 11/12/2022   Stable, pt to  continue current medical treatment  - diet,wt control   Essential hypertension BP Readings from Last 3 Encounters:  10/01/23 116/68  01/12/23 126/72  12/09/22 118/70   Stable, pt to continue medical  treatment losartan  25 mg qd  Followup: Return if symptoms worsen or fail to improve.  Lynwood Rush, MD 10/01/2023 8:16 PM [facility]

## 2023-10-01 NOTE — Assessment & Plan Note (Signed)
 Mild to mod, for cipro  500, also trial ciprodex  if not too expensive per pt request,  to f/u any worsening symptoms or concerns

## 2023-10-07 ENCOUNTER — Other Ambulatory Visit: Payer: Self-pay | Admitting: Internal Medicine

## 2023-11-03 LAB — LAB REPORT - SCANNED: A1c: 5.9

## 2023-11-11 ENCOUNTER — Encounter: Payer: Self-pay | Admitting: Internal Medicine

## 2023-11-11 DIAGNOSIS — N1831 Chronic kidney disease, stage 3a: Secondary | ICD-10-CM | POA: Insufficient documentation

## 2023-11-11 NOTE — Patient Instructions (Addendum)

## 2023-11-11 NOTE — Progress Notes (Unsigned)
 Subjective:    Patient ID: Rebecca Cameron, female    DOB: 1945/04/15, 78 y.o.   MRN: [id number]      HPI Rebecca Cameron is here for a Physical exam and her chronic medical problems.   Unusually tired x 2 week.  Sleep is okay.  Her sister has been sick and was in [place]  and that is causing increased rest.  She thinks her sleep is about the same-not always great, but no major changes.  No changes in activity, diet.  Medications and allergies reviewed with patient and updated if appropriate.  Current Outpatient Medications on File Prior to Visit  Medication Sig Dispense Refill   losartan  (COZAAR ) 25 MG tablet TAKE ONE TABLET BY MOUTH ONCE A DAY 90 tablet 0   Multiple Vitamin (MULTIVITAMIN) capsule Take 1 capsule by mouth daily.     Probiotic Product (PROBIOTIC-10 PO) See admin instructions.     rosuvastatin  (CRESTOR ) 20 MG tablet TAKE ONE TABLET BY MOUTH ONE TIME DAILY 90 tablet 0   No current facility-administered medications on file prior to visit.    Review of Systems  Constitutional:  Negative for fever.  HENT:  Negative for trouble swallowing.   Eyes:  Negative for visual disturbance.  Respiratory:  Negative for cough, shortness of breath and wheezing.   Cardiovascular:  Negative for chest pain, palpitations and leg swelling.  Gastrointestinal:  Negative for abdominal pain, blood in stool, constipation and diarrhea.       Occ gerd - increased recently  Genitourinary:  Negative for dysuria.  Musculoskeletal:  Positive for arthralgias (mild, left lateral). Negative for back pain.  Skin:  Negative for rash.  Neurological:  Negative for light-headedness and headaches.  Psychiatric/Behavioral:  Positive for sleep disturbance (intermittent). Negative for dysphoric mood. The patient is not nervous/anxious.        Objective:   Vitals:   11/12/23 0830  BP: 118/62  Pulse: 77  Temp: 98 F (36.7 C)  SpO2: 98%   Filed Weights   11/12/23 0830  Weight: 145 lb (65.8 kg)   Body  mass index is 24.89 kg/m.  BP Readings from Last 3 Encounters:  11/12/23 118/62  10/01/23 116/68  01/12/23 126/72    Wt Readings from Last 3 Encounters:  11/12/23 145 lb (65.8 kg)  10/01/23 146 lb (66.2 kg)  12/09/22 142 lb (64.4 kg)       Physical Exam Constitutional: She appears well-developed and well-nourished. No distress.  HENT:  Head: Normocephalic and atraumatic.  Right Ear: External ear normal. Normal ear canal and TM Left Ear: External ear normal.  Normal ear canal and TM Mouth/Throat: Oropharynx is clear and moist.  Eyes: Conjunctivae normal.  Neck: Neck supple. No tracheal deviation present. No thyromegaly present.  No carotid bruit  Cardiovascular: Normal rate, regular rhythm and normal heart sounds.   No murmur heard.  No edema. Pulmonary/Chest: Effort normal and breath sounds normal. No respiratory distress. She has no wheezes. She has no rales.  Breast: deferred   Abdominal: Soft. She exhibits no distension. There is no tenderness.  Lymphadenopathy: She has no cervical adenopathy.  Skin: Skin is warm and dry. She is not diaphoretic.  Psychiatric: She has a normal mood and affect. Her behavior is normal.     Lab Results  Component Value Date   WBC 5.4 11/12/2023   HGB 13.4 11/12/2023   HCT 41.3 11/12/2023   PLT 223.0 11/12/2023   GLUCOSE 61 (L) 11/12/2023   CHOL 169 11/12/2023  TRIG 111.0 11/12/2023   HDL 82.50 11/12/2023   LDLDIRECT 140.7 03/22/2012   LDLCALC 64 11/12/2023   ALT 14 11/12/2023   AST 22 11/12/2023   NA 139 11/12/2023   K 4.1 11/12/2023   CL 105 11/12/2023   CREATININE 1.01 11/12/2023   BUN 22 11/12/2023   CO2 25 11/12/2023   TSH 1.91 11/12/2023   HGBA1C 6.3 11/12/2023   MICROALBUR <0.7 11/12/2023         Assessment & Plan:   Physical exam: Screening blood work  ordered Exercise  walking dog,bike riding, golf Weight  normal Substance abuse  none   Reviewed recommended immunizations.   Health Maintenance   Topic Date Due   Medicare Annual Wellness (AWV)  11/15/2022   COVID-19 Vaccine (7 - 2025-26 season) 10/19/2023   DEXA SCAN  11/16/2025   DTaP/Tdap/Td (4 - Td or Tdap) 09/17/2026   Pneumococcal Vaccine: 50+ Years  Completed   Influenza Vaccine  Completed   Hepatitis C Screening  Completed   HPV VACCINES  Aged Out   Meningococcal B Vaccine  Aged Out   Mammogram  Discontinued   Colonoscopy  Discontinued   Zoster Vaccines- Shingrix  Discontinued          See Problem List for Assessment and Plan of chronic medical problems.

## 2023-11-12 ENCOUNTER — Ambulatory Visit: Payer: Medicare PPO | Admitting: Internal Medicine

## 2023-11-12 VITALS — BP 118/62 | HR 77 | Temp 98.0°F | Ht 64.0 in | Wt 145.0 lb

## 2023-11-12 DIAGNOSIS — Z0000 Encounter for general adult medical examination without abnormal findings: Secondary | ICD-10-CM

## 2023-11-12 DIAGNOSIS — R7989 Other specified abnormal findings of blood chemistry: Secondary | ICD-10-CM | POA: Diagnosis not present

## 2023-11-12 DIAGNOSIS — F5101 Primary insomnia: Secondary | ICD-10-CM | POA: Diagnosis not present

## 2023-11-12 DIAGNOSIS — I10 Essential (primary) hypertension: Secondary | ICD-10-CM

## 2023-11-12 DIAGNOSIS — R7303 Prediabetes: Secondary | ICD-10-CM

## 2023-11-12 DIAGNOSIS — E538 Deficiency of other specified B group vitamins: Secondary | ICD-10-CM

## 2023-11-12 DIAGNOSIS — M8589 Other specified disorders of bone density and structure, multiple sites: Secondary | ICD-10-CM | POA: Diagnosis not present

## 2023-11-12 DIAGNOSIS — N1832 Chronic kidney disease, stage 3b: Secondary | ICD-10-CM

## 2023-11-12 DIAGNOSIS — E7800 Pure hypercholesterolemia, unspecified: Secondary | ICD-10-CM

## 2023-11-12 LAB — CBC
HCT: 41.3 % (ref 36.0–46.0)
Hemoglobin: 13.4 g/dL (ref 12.0–15.0)
MCHC: 32.6 g/dL (ref 30.0–36.0)
MCV: 96.2 fl (ref 78.0–100.0)
Platelets: 223 K/uL (ref 150.0–400.0)
RBC: 4.29 Mil/uL (ref 3.87–5.11)
RDW: 13.7 % (ref 11.5–15.5)
WBC: 5.4 K/uL (ref 4.0–10.5)

## 2023-11-12 LAB — URINALYSIS, ROUTINE W REFLEX MICROSCOPIC
Bilirubin Urine: NEGATIVE
Hgb urine dipstick: NEGATIVE
Ketones, ur: NEGATIVE
Leukocytes,Ua: NEGATIVE
Nitrite: NEGATIVE
RBC / HPF: NONE SEEN (ref 0–?)
Specific Gravity, Urine: 1.01 (ref 1.000–1.030)
Total Protein, Urine: NEGATIVE
Urine Glucose: NEGATIVE
Urobilinogen, UA: 0.2 (ref 0.0–1.0)
WBC, UA: NONE SEEN (ref 0–?)
pH: 6 (ref 5.0–8.0)

## 2023-11-12 LAB — COMPREHENSIVE METABOLIC PANEL WITH GFR
ALT: 14 U/L (ref 0–35)
AST: 22 U/L (ref 0–37)
Albumin: 4.6 g/dL (ref 3.5–5.2)
Alkaline Phosphatase: 47 U/L (ref 39–117)
BUN: 22 mg/dL (ref 6–23)
CO2: 25 meq/L (ref 19–32)
Calcium: 9.8 mg/dL (ref 8.4–10.5)
Chloride: 105 meq/L (ref 96–112)
Creatinine, Ser: 1.01 mg/dL (ref 0.40–1.20)
GFR: 53.83 mL/min — ABNORMAL LOW (ref >60.00)
Glucose, Bld: 61 mg/dL — ABNORMAL LOW (ref 70–99)
Potassium: 4.1 meq/L (ref 3.5–5.1)
Sodium: 139 meq/L (ref 135–145)
Total Bilirubin: 0.5 mg/dL (ref 0.2–1.2)
Total Protein: 7.8 g/dL (ref 6.0–8.3)

## 2023-11-12 LAB — LIPID PANEL
Cholesterol: 169 mg/dL (ref 0–200)
HDL: 82.5 mg/dL (ref >39.00)
LDL Cholesterol: 64 mg/dL (ref 0–99)
NonHDL: 86.67
Total CHOL/HDL Ratio: 2
Triglycerides: 111 mg/dL (ref 0.0–149.0)
VLDL: 22.2 mg/dL (ref 0.0–40.0)

## 2023-11-12 LAB — VITAMIN B12: Vitamin B-12: 333 pg/mL (ref 211–911)

## 2023-11-12 LAB — VITAMIN D 25 HYDROXY (VIT D DEFICIENCY, FRACTURES): VITD: 38.21 ng/mL (ref 30.00–100.00)

## 2023-11-12 LAB — MICROALBUMIN / CREATININE URINE RATIO
Creatinine,U: 50.3 mg/dL
Microalb Creat Ratio: UNDETERMINED mg/g (ref 0.0–30.0)
Microalb, Ur: <0.7 mg/dL

## 2023-11-12 LAB — TSH: TSH: 1.91 u[IU]/mL (ref 0.35–5.50)

## 2023-11-12 LAB — HEMOGLOBIN A1C: Hgb A1c MFr Bld: 6.3 % (ref 4.6–6.5)

## 2023-11-12 MED ORDER — ZOLPIDEM TARTRATE 5 MG PO TABS: 5.0000 mg | ORAL_TABLET | Freq: Every day | ORAL | 5 refills | Status: AC

## 2023-11-12 NOTE — Assessment & Plan Note (Signed)
 Chronic BP well controlled Cbc, cmp Continue losartan  25 mg daily

## 2023-11-12 NOTE — Assessment & Plan Note (Addendum)
 Chronic Check lipid panel, CMP, TSH Continue crestor 20 mg daily Regular exercise and healthy diet encouraged'

## 2023-11-12 NOTE — Assessment & Plan Note (Signed)
 Chronic ?Check B12 level ?

## 2023-11-12 NOTE — Assessment & Plan Note (Signed)
Chronic Intermittent Uses ambien 5 mg nightly prn only - continue

## 2023-11-12 NOTE — Assessment & Plan Note (Signed)
 Chronic Lab Results  Component Value Date   HGBA1C 6.0 11/12/2022   Check a1c Low sugar / carb diet Stressed regular exercise

## 2023-11-12 NOTE — Assessment & Plan Note (Addendum)
 Chronic ?  Cause Stable and unlikely to progress Cmp, cbc, ua, urine albumin/cr ratio Ultrasound of kidneys unremarkable Will consider referral to nephrology if GFR decreases or signs of progression

## 2023-11-12 NOTE — Assessment & Plan Note (Signed)
 Chronic At one point did have osteoporosis S/p fosamax  x years and forteo x 2 years Bone density relatively stable for the past 7 years which is fairly amazing FRAX is high likely related to history of compression fracture-she is not sure exactly how that happened-no major injury Continue calcium  and vitamin D  daily Check vitamin D  level Continue regular exercise No treatment needed at this time DEXA in 2026

## 2023-11-13 ENCOUNTER — Ambulatory Visit

## 2023-11-13 VITALS — Ht 64.0 in | Wt 145.0 lb

## 2023-11-13 DIAGNOSIS — Z0000 Encounter for general adult medical examination without abnormal findings: Secondary | ICD-10-CM | POA: Diagnosis not present

## 2023-11-13 NOTE — Progress Notes (Signed)
 Subjective:   Rebecca Cameron is a 78 y.o. who presents for a Medicare Wellness preventive visit.  As a reminder, Annual Wellness Visits don't include a physical exam, and some assessments may be limited, especially if this visit is performed virtually. We may recommend an in-person follow-up visit with your provider if needed.  Visit Complete: Virtual I connected with  Meade JINNY Simpers on 11/13/23 by a audio enabled telemedicine application and verified that I am speaking with the correct person using two identifiers.  Patient Location: Home  Provider Location: Office/Clinic  I discussed the limitations of evaluation and management by telemedicine. The patient expressed understanding and agreed to proceed.  Vital Signs: Because this visit was a virtual/telehealth visit, some criteria may be missing or patient reported. Any vitals not documented were not able to be obtained and vitals that have been documented are patient reported.  VideoDeclined- This patient declined Librarian, academic. Therefore the visit was completed with audio only.  Persons Participating in Visit: Patient.  AWV Questionnaire: Yes: Patient Medicare AWV questionnaire was completed by the patient on 11/11/2023; I have confirmed that all information answered by patient is correct and no changes since this date.  NO CHANGES IN PHQ9 FROM 11/12/2023-PER PT  Cardiac Risk Factors include: advanced age (>70men, >35 women);hypertension;dyslipidemia;Other (see comment), Risk factor comments: stage 3b CKD     Objective:    Today's Vitals   11/13/23 0811  Weight: 145 lb (65.8 kg)  Height: 5' 4 (1.626 m)   Body mass index is 24.89 kg/m.     11/13/2023    8:15 AM 11/14/2021    3:51 PM 05/01/2014    9:58 AM  Advanced Directives  Does Patient Have a Medical Advance Directive? Yes Yes Yes   Type of Special educational needs teacher of [place];Living will Healthcare Power of  [place];Living will   Copy of Healthcare Power of Attorney in Chart?  No - copy requested      Data saved with a previous flowsheet row definition    Current Medications (verified) Outpatient Encounter Medications as of 11/13/2023  Medication Sig   losartan  (COZAAR ) 25 MG tablet TAKE ONE TABLET BY MOUTH ONCE A DAY   Multiple Vitamin (MULTIVITAMIN) capsule Take 1 capsule by mouth daily.   Probiotic Product (PROBIOTIC-10 PO) See admin instructions.   rosuvastatin  (CRESTOR ) 20 MG tablet TAKE ONE TABLET BY MOUTH ONE TIME DAILY   zolpidem  (AMBIEN ) 5 MG tablet Take 1 tablet (5 mg total) by mouth at bedtime.   No facility-administered encounter medications on file as of 11/13/2023.    Allergies (verified) Patient has no known allergies.   History: Past Medical History:  Diagnosis Date   Arthritis    Basal cell carcinoma 07/08/2005   right post shoulder tx curet x3 34fu   BCC (basal cell carcinoma of skin) 10/13/2007   left upper arm tx curet x3 25fu   Compression fracture of spine, non-traumatic (HCC)    Diverticulosis    Glaucoma    Migraine headache    Osteopenia    PMS (premenstrual syndrome)    SCC (squamous cell carcinoma) 03/27/2015   lateeral under left eye tx imiquimod   SCC (squamous cell carcinoma) 03/27/2015   medial under eye tx imiquimod   SCC (squamous cell carcinoma) 03/24/2016   left cheek TX cx3 84fu cautery, EXC   Squamous cell carcinoma of skin 03/27/2015   left angle of jaw tx imiquimod   Squamous cell carcinoma of skin  08/03/2019   in situ-left malar cheek (CX35FU)   Past Surgical History:  Procedure Laterality Date   CATARACT EXTRACTION  06-2014   COLONOSCOPY  2006   LUMBAR DISC SURGERY  2003   Family History  Problem Relation Age of Onset   Alzheimer's disease Mother    Hip fracture Father    Alcohol abuse Father    Glaucoma Sister    Alcohol abuse Brother    Ovarian cancer Sister    Colon cancer Neg Hx    Social History   Socioeconomic  History   Marital status: Single    Spouse name: Not on file   Number of children: 0   Years of education: Not on file   Highest education level: Bachelor's degree (e.g., BA, AB, BS)  Occupational History   Occupation: retired  Tobacco Use   Smoking status: Never   Smokeless tobacco: Never  Vaping Use   Vaping status: Never Used  Substance and Sexual Activity   Alcohol use: Yes    Alcohol/week: 4.0 standard drinks of alcohol    Types: 2 Standard drinks or equivalent, 2 Cans of beer per week    Comment: OCC.   Drug use: No   Sexual activity: Not on file  Other Topics Concern   Not on file  Social History Narrative   Lives alone 2025 and has 1 dog   Social Drivers of Corporate investment banker Strain: Low Risk  (11/09/2023)   Overall Financial Resource Strain (CARDIA)    Difficulty of Paying Living Expenses: Not hard at all  Food Insecurity: No Food Insecurity (11/09/2023)   Hunger Vital Sign    Worried About Running Out of Food in the Last Year: Never true    Ran Out of Food in the Last Year: Never true  Transportation Needs: No Transportation Needs (11/09/2023)   PRAPARE - Administrator, Civil Service (Medical): No    Lack of Transportation (Non-Medical): No  Physical Activity: Sufficiently Active (11/09/2023)   Exercise Vital Sign    Days of Exercise per Week: 6 days    Minutes of Exercise per Session: 40 min  Stress: No Stress Concern Present (11/09/2023)   [employer] of Occupational Health - Occupational Stress Questionnaire    Feeling of Stress: Only a little  Social Connections: Moderately Isolated (11/09/2023)   Social Connection and Isolation Panel    Frequency of Communication with Friends and Family: More than three times a week    Frequency of Social Gatherings with Friends and Family: More than three times a week    Attends Religious Services: Never    Database administrator or Organizations: Yes    Attends Engineer, structural:  More than 4 times per year    Marital Status: Never married    Tobacco Counseling Counseling given: Not Answered    Clinical Intake:  Pre-visit preparation completed: Yes  Pain : No/denies pain     BMI - recorded: 24.89 Nutritional Status: BMI of 19-24  Normal Nutritional Risks: None  Lab Results  Component Value Date   HGBA1C 6.3 11/12/2023   HGBA1C 6.0 11/12/2022   HGBA1C 5.9 10/30/2021     How often do you need to have someone help you when you read instructions, pamphlets, or other written materials from your doctor or pharmacy?: 1 - Never  Interpreter Needed?: No  Information entered by :: Doniven Vanpatten, RMA   Activities of Daily Living     11/11/2023  4:27 PM  In your present state of health, do you have any difficulty performing the following activities:  Hearing? 0  Vision? 0  Difficulty concentrating or making decisions? 0  Walking or climbing stairs? 0  Dressing or bathing? 0  Doing errands, shopping? 0  Preparing Food and eating ? N  Using the Toilet? N  In the past six months, have you accidently leaked urine? N  Do you have problems with loss of bowel control? N  Managing your Medications? N  Managing your Finances? N  Housekeeping or managing your Housekeeping? N    Patient Care Team: Geofm Glade PARAS, MD as PCP - General (Internal Medicine) Livingston Rigg, MD as Consulting Physician (Dermatology) Leila Bound, OD as Consulting Physician (Optometry)  I have updated your Care Teams any recent Medical Services you may have received from other providers in the past year.     Assessment:   This is a routine wellness examination for [place].  Hearing/Vision screen Hearing Screening - Comments:: Denies hearing difficulties   Vision Screening - Comments:: Wears eyeglasses for Driving/ Dr. GORMAN Pinal   Goals Addressed             This Visit's Progress    Patient Stated   On track    My goal is to stay healthy, physically and socially  active.       Depression Screen     11/12/2023    8:36 AM 10/01/2023    3:10 PM 09/30/2022    9:07 AM 06/06/2022    3:03 PM 11/14/2021    4:05 PM 10/30/2021    7:57 AM 07/19/2015   10:07 AM  PHQ 2/9 Scores  PHQ - 2 Score 1 0 0 0 0 0 0  PHQ- 9 Score 3          Fall Risk     11/12/2023    8:36 AM 11/11/2023    4:27 PM 09/30/2022    9:07 AM 06/06/2022    3:03 PM 11/14/2021    3:51 PM  Fall Risk   Falls in the past year? 0 0 0 0 0  Number falls in past yr: 0 0 0 0 0  Injury with Fall? 0 0 0 0 0  Risk for fall due to : No Fall Risks  No Fall Risks No Fall Risks No Fall Risks  Follow up Falls evaluation completed Falls evaluation completed;Falls prevention discussed Falls evaluation completed Falls evaluation completed Falls prevention discussed      Data saved with a previous flowsheet row definition    MEDICARE RISK AT HOME:  Medicare Risk at Home Any stairs in or around the home?: (Patient-Rptd) Yes If so, are there any without handrails?: (Patient-Rptd) No Home free of loose throw rugs in walkways, pet beds, electrical cords, etc?: (Patient-Rptd) No Adequate lighting in your home to reduce risk of falls?: (Patient-Rptd) Yes Life alert?: (Patient-Rptd) No Use of a cane, walker or w/c?: (Patient-Rptd) No Grab bars in the bathroom?: (Patient-Rptd) Yes Shower chair or bench in shower?: (Patient-Rptd) No Elevated toilet seat or a handicapped toilet?: (Patient-Rptd) No  TIMED UP AND GO:  Was the test performed?  No  Cognitive Function: Declined/Normal: No cognitive concerns noted by patient or family. Patient alert, oriented, able to answer questions appropriately and recall recent events. No signs of memory loss or confusion.        11/14/2021    4:01 PM  6CIT Screen  What Year? 0 points  What month? 0  points  What time? 0 points  Count back from 20 0 points  Months in reverse 0 points  Repeat phrase 0 points  Total Score 0 points    Immunizations Immunization History   Administered Date(s) Administered   Fluad Quad(high Dose 65+) 11/11/2023   Influenza Split 12/16/2012, 11/22/2018, 11/11/2019   Influenza Whole 02/18/2004, 11/18/2006, 11/20/2009   Influenza-Unspecified 11/13/2021   Moderna Covid-19 Vaccine Bivalent Booster 80yrs & up 11/12/2020   Moderna Sars-Covid-2 Vaccination 03/22/2019, 04/21/2019, 12/21/2019, 05/30/2020   Pfizer Covid-19 Vaccine Bivalent Booster 5yrs & up 10/29/2022   Pneumococcal Conjugate-13 05/05/2013, 09/16/2016   Pneumococcal Polysaccharide-23 05/29/2014, 09/18/2017   Td 02/18/2004, 05/29/2014   Tdap 09/16/2016   Zoster, Live 04/05/2008, 12/18/2016, 03/17/2017    Screening Tests Health Maintenance  Topic Date Due   Medicare Annual Wellness (AWV)  11/15/2022   COVID-19 Vaccine (7 - 2025-26 season) 10/19/2023   DEXA SCAN  11/16/2025   DTaP/Tdap/Td (4 - Td or Tdap) 09/17/2026   Pneumococcal Vaccine: 50+ Years  Completed   Influenza Vaccine  Completed   Hepatitis C Screening  Completed   HPV VACCINES  Aged Out   Meningococcal B Vaccine  Aged Out   Mammogram  Discontinued   Colonoscopy  Discontinued   Zoster Vaccines- Shingrix  Discontinued    Health Maintenance Items Addressed: See Nurse Notes at the end of this note  Additional Screening:  Vision Screening: Recommended annual ophthalmology exams for early detection of glaucoma and other disorders of the eye. Is the patient up to date with their annual eye exam?  No  Who is the provider or what is the name of the office in which the patient attends annual eye exams? [facility]  Dental Screening: Recommended annual dental exams for proper oral hygiene  Community Resource Referral / Chronic Care Management: CRR required this visit?  No   CCM required this visit?  No   Plan:    I have personally reviewed and noted the following in the patient's chart:   Medical and social history Use of alcohol, tobacco or illicit drugs  Current medications and  supplements including opioid prescriptions. Patient is not currently taking opioid prescriptions. Functional ability and status Nutritional status Physical activity Advanced directives List of other physicians Hospitalizations, surgeries, and ER visits in previous 12 months Vitals Screenings to include cognitive, depression, and falls Referrals and appointments  In addition, I have reviewed and discussed with patient certain preventive protocols, quality metrics, and best practice recommendations. A written personalized care plan for preventive services as well as general preventive health recommendations were provided to patient.   Kenora Spayd L Denman Pichardo, CMA   11/13/2023   After Visit Summary: (MyChart) Due to this being a telephonic visit, the after visit summary with patients personalized plan was offered to patient via MyChart   Notes: Nothing significant to report at this time.

## 2023-11-13 NOTE — Patient Instructions (Signed)
 Rebecca Cameron,  Thank you for taking the time for your Medicare Wellness Visit. I appreciate your continued commitment to your health goals. Please review the care plan we discussed, and feel free to reach out if I can assist you further.  Medicare recommends these wellness visits once per year to help you and your care team stay ahead of potential health issues. These visits are designed to focus on prevention, allowing your provider to concentrate on managing your acute and chronic conditions during your regular appointments.  Please note that Annual Wellness Visits do not include a physical exam. Some assessments may be limited, especially if the visit was conducted virtually. If needed, we may recommend a separate in-person follow-up with your provider.  Ongoing Care Seeing your primary care provider every 3 to 6 months helps us  monitor your health and provide consistent, personalized care. Last office visit on 11/12/2023.  Next office visit on 05/12/2024.  Keep up the good work.  Referrals If a referral was made during today's visit and you haven't received any updates within two weeks, please contact the referred provider directly to check on the status.  Recommended Screenings:  Health Maintenance  Topic Date Due   Medicare Annual Wellness Visit  11/15/2022   COVID-19 Vaccine (7 - 2025-26 season) 10/19/2023   DEXA scan (bone density measurement)  11/16/2025   DTaP/Tdap/Td vaccine (4 - Td or Tdap) 09/17/2026   Pneumococcal Vaccine for age over 56  Completed   Flu Shot  Completed   Hepatitis C Screening  Completed   HPV Vaccine  Aged Out   Meningitis B Vaccine  Aged Out   Breast Cancer Screening  Discontinued   Colon Cancer Screening  Discontinued   Zoster (Shingles) Vaccine  Discontinued       11/13/2023    8:15 AM  Advanced Directives  Does Patient Have a Medical Advance Directive? Yes   Advance Care Planning is important because it: Ensures you receive medical care that  aligns with your values, goals, and preferences. Provides guidance to your family and loved ones, reducing the emotional burden of decision-making during critical moments.  Vision: Annual vision screenings are recommended for early detection of glaucoma, cataracts, and diabetic retinopathy. These exams can also reveal signs of chronic conditions such as diabetes and high blood pressure.  Dental: Annual dental screenings help detect early signs of oral cancer, gum disease, and other conditions linked to overall health, including heart disease and diabetes.  Please see the attached documents for additional preventive care recommendations.

## 2023-11-14 ENCOUNTER — Ambulatory Visit: Payer: Self-pay | Admitting: Internal Medicine

## 2023-12-24 DIAGNOSIS — M1712 Unilateral primary osteoarthritis, left knee: Secondary | ICD-10-CM | POA: Diagnosis not present

## 2024-01-18 ENCOUNTER — Other Ambulatory Visit: Payer: Self-pay | Admitting: Internal Medicine

## 2024-05-12 ENCOUNTER — Ambulatory Visit: Admitting: Internal Medicine

## 2024-11-17 ENCOUNTER — Ambulatory Visit
